# Patient Record
Sex: Male | Born: 1965
Health system: Southern US, Community
[De-identification: ages and names within clinical notes are randomized; demographics above are authoritative.]

## PROBLEM LIST (undated history)

## (undated) DIAGNOSIS — E785 Hyperlipidemia, unspecified: Secondary | ICD-10-CM

## (undated) DIAGNOSIS — G839 Paralytic syndrome, unspecified: Secondary | ICD-10-CM

## (undated) DIAGNOSIS — D496 Neoplasm of unspecified behavior of brain: Secondary | ICD-10-CM

## (undated) DIAGNOSIS — I10 Essential (primary) hypertension: Secondary | ICD-10-CM

## (undated) HISTORY — DX: Hyperlipidemia, unspecified: E78.5

## (undated) HISTORY — PX: OTHER SURGICAL HISTORY: SHX169

## (undated) HISTORY — DX: Essential (primary) hypertension: I10

## (undated) HISTORY — PX: CHOLECYSTECTOMY: SHX55

---

## 2011-12-16 DIAGNOSIS — S83289A Other tear of lateral meniscus, current injury, unspecified knee, initial encounter: Secondary | ICD-10-CM | POA: Insufficient documentation

## 2019-08-23 NOTE — Progress Notes (Deleted)
    Referring-palpitations Reason for referral-self-referral  HPI: 54 year old male for evaluation of palpitations.  Previously lived in [place].  No current outpatient medications on file.   No current facility-administered medications for this visit.    Not on File  No past medical history on file.  *** The histories are not reviewed yet. Please review them in the "History" navigator section and refresh this [place].  Social History   Socioeconomic History  . Marital status: Married    Spouse name: Not on file  . Number of children: Not on file  . Years of education: Not on file  . Highest education level: Not on file  Occupational History  . Not on file  Tobacco Use  . Smoking status: Not on file  Substance and Sexual Activity  . Alcohol use: Not on file  . Drug use: Not on file  . Sexual activity: Not on file  Other Topics Concern  . Not on file  Social History Narrative  . Not on file   Social Determinants of Health   Financial Resource Strain:   . Difficulty of Paying Living Expenses:   Food Insecurity:   . Worried About Charity fundraiser in the Last Year:   . Arboriculturist in the Last Year:   Transportation Needs:   . Film/video editor (Medical):   Marland Kitchen Lack of Transportation (Non-Medical):   Physical Activity:   . Days of Exercise per Week:   . Minutes of Exercise per Session:   Stress:   . Feeling of Stress :   Social Connections:   . Frequency of Communication with Friends and Family:   . Frequency of Social Gatherings with Friends and Family:   . Attends Religious Services:   . Active Member of Clubs or Organizations:   . Attends Archivist Meetings:   Marland Kitchen Marital Status:   Intimate Partner Violence:   . Fear of Current or Ex-Partner:   . Emotionally Abused:   Marland Kitchen Physically Abused:   . Sexually Abused:     No family history on file.  ROS: no fevers or chills, productive cough, hemoptysis, dysphasia, odynophagia, melena,  hematochezia, dysuria, hematuria, rash, seizure activity, orthopnea, PND, pedal edema, claudication. Remaining systems are negative.  Physical Exam:   There were no vitals taken for this visit.  General:  Well developed/well nourished in NAD Skin warm/dry Patient not depressed No peripheral clubbing Back-normal HEENT-normal/normal eyelids Neck supple/normal carotid upstroke bilaterally; no bruits; no JVD; no thyromegaly chest - CTA/ normal expansion CV - RRR/normal S1 and S2; no murmurs, rubs or gallops;  PMI nondisplaced Abdomen -NT/ND, no HSM, no mass, + bowel sounds, no bruit 2+ femoral pulses, no bruits Ext-no edema, chords, 2+ DP Neuro-grossly nonfocal  ECG - personally reviewed  A/P  1 palpitations-  Kirk Ruths, MD

## 2019-08-28 DIAGNOSIS — Z0000 Encounter for general adult medical examination without abnormal findings: Secondary | ICD-10-CM | POA: Diagnosis not present

## 2019-08-28 DIAGNOSIS — I10 Essential (primary) hypertension: Secondary | ICD-10-CM | POA: Diagnosis not present

## 2019-08-28 DIAGNOSIS — Z136 Encounter for screening for cardiovascular disorders: Secondary | ICD-10-CM | POA: Diagnosis not present

## 2019-08-28 DIAGNOSIS — E669 Obesity, unspecified: Secondary | ICD-10-CM | POA: Diagnosis not present

## 2019-08-28 DIAGNOSIS — R002 Palpitations: Secondary | ICD-10-CM | POA: Diagnosis not present

## 2019-08-28 DIAGNOSIS — Z125 Encounter for screening for malignant neoplasm of prostate: Secondary | ICD-10-CM | POA: Diagnosis not present

## 2019-08-28 DIAGNOSIS — Z131 Encounter for screening for diabetes mellitus: Secondary | ICD-10-CM | POA: Diagnosis not present

## 2019-08-29 ENCOUNTER — Ambulatory Visit: Payer: Self-pay | Admitting: Cardiology

## 2019-08-31 DIAGNOSIS — I10 Essential (primary) hypertension: Secondary | ICD-10-CM | POA: Diagnosis not present

## 2019-09-13 DIAGNOSIS — R002 Palpitations: Secondary | ICD-10-CM | POA: Diagnosis not present

## 2019-09-13 DIAGNOSIS — E782 Mixed hyperlipidemia: Secondary | ICD-10-CM | POA: Diagnosis not present

## 2019-09-13 DIAGNOSIS — E669 Obesity, unspecified: Secondary | ICD-10-CM | POA: Diagnosis not present

## 2019-09-13 DIAGNOSIS — I10 Essential (primary) hypertension: Secondary | ICD-10-CM | POA: Diagnosis not present

## 2019-09-13 NOTE — Progress Notes (Deleted)
    Referring-self-referral Reason for referral-palpitations  HPI: 54 year old male for evaluation of palpitations.  No current outpatient medications on file.   No current facility-administered medications for this visit.    Not on File  No past medical history on file.  *** The histories are not reviewed yet. Please review them in the "History" navigator section and refresh this [place].  Social History   Socioeconomic History  . Marital status: Married    Spouse name: Not on file  . Number of children: Not on file  . Years of education: Not on file  . Highest education level: Not on file  Occupational History  . Not on file  Tobacco Use  . Smoking status: Not on file  Substance and Sexual Activity  . Alcohol use: Not on file  . Drug use: Not on file  . Sexual activity: Not on file  Other Topics Concern  . Not on file  Social History Narrative  . Not on file   Social Determinants of Health   Financial Resource Strain:   . Difficulty of Paying Living Expenses:   Food Insecurity:   . Worried About Charity fundraiser in the Last Year:   . Arboriculturist in the Last Year:   Transportation Needs:   . Film/video editor (Medical):   Marland Kitchen Lack of Transportation (Non-Medical):   Physical Activity:   . Days of Exercise per Week:   . Minutes of Exercise per Session:   Stress:   . Feeling of Stress :   Social Connections:   . Frequency of Communication with Friends and Family:   . Frequency of Social Gatherings with Friends and Family:   . Attends Religious Services:   . Active Member of Clubs or Organizations:   . Attends Archivist Meetings:   Marland Kitchen Marital Status:   Intimate Partner Violence:   . Fear of Current or Ex-Partner:   . Emotionally Abused:   Marland Kitchen Physically Abused:   . Sexually Abused:     No family history on file.  ROS: no fevers or chills, productive cough, hemoptysis, dysphasia, odynophagia, melena, hematochezia, dysuria,  hematuria, rash, seizure activity, orthopnea, PND, pedal edema, claudication. Remaining systems are negative.  Physical Exam:   There were no vitals taken for this visit.  General:  Well developed/well nourished in NAD Skin warm/dry Patient not depressed No peripheral clubbing Back-normal HEENT-normal/normal eyelids Neck supple/normal carotid upstroke bilaterally; no bruits; no JVD; no thyromegaly chest - CTA/ normal expansion CV - RRR/normal S1 and S2; no murmurs, rubs or gallops;  PMI nondisplaced Abdomen -NT/ND, no HSM, no mass, + bowel sounds, no bruit 2+ femoral pulses, no bruits Ext-no edema, chords, 2+ DP Neuro-grossly nonfocal  ECG - personally reviewed  A/P  1 palpitations-  Kirk Ruths, MD

## 2019-09-19 ENCOUNTER — Ambulatory Visit: Payer: Self-pay | Admitting: Cardiology

## 2019-09-19 ENCOUNTER — Encounter: Payer: Self-pay | Admitting: Cardiology

## 2019-09-19 ENCOUNTER — Ambulatory Visit (INDEPENDENT_AMBULATORY_CARE_PROVIDER_SITE_OTHER): Payer: BC Managed Care – PPO | Admitting: Cardiology

## 2019-09-19 ENCOUNTER — Other Ambulatory Visit: Payer: Self-pay

## 2019-09-19 ENCOUNTER — Telehealth: Payer: Self-pay | Admitting: Radiology

## 2019-09-19 VITALS — BP 151/109 | HR 73 | Ht 69.0 in | Wt 219.0 lb

## 2019-09-19 DIAGNOSIS — R002 Palpitations: Secondary | ICD-10-CM | POA: Diagnosis not present

## 2019-09-19 DIAGNOSIS — I10 Essential (primary) hypertension: Secondary | ICD-10-CM

## 2019-09-19 MED ORDER — AMLODIPINE BESYLATE 5 MG PO TABS: 5.0000 mg | ORAL_TABLET | Freq: Every day | ORAL | 3 refills | Status: DC

## 2019-09-19 NOTE — Progress Notes (Signed)
Referring-Sean Vanstory PA Reason for referral-palpitations  HPI: 54 year old male for evaluation of palpitations at request of Raelyn Number PA.  Patient has had occasional palpitations for approximately 7 years.  However they have worsened recently.  One type is described as a strong beat.  He also has times when his heart races and is associated with dizziness.  He has not had syncope.  He otherwise denies dyspnea on exertion, orthopnea, PND, pedal edema or exertional chest pain.  Cardiology now asked to evaluate.  Current Outpatient Medications  Medication Sig Dispense Refill  . meloxicam (MOBIC) 7.5 MG tablet Take 7.5 mg by mouth daily.    . rosuvastatin (CRESTOR) 10 MG tablet Take 10 mg by mouth daily.     No current facility-administered medications for this visit.    No Known Allergies   Past Medical History:  Diagnosis Date  . Hyperlipidemia   . Hypertension     Past Surgical History:  Procedure Laterality Date  . Arthroscopic knee surgery    . CHOLECYSTECTOMY      Social History   Socioeconomic History  . Marital status: Married    Spouse name: Not on file  . Number of children: 2  . Years of education: Not on file  . Highest education level: Not on file  Occupational History  . Not on file  Tobacco Use  . Smoking status: Never Smoker  . Smokeless tobacco: Never Used  Substance and Sexual Activity  . Alcohol use: Never  . Drug use: Never  . Sexual activity: Not on file  Other Topics Concern  . Not on file  Social History Narrative  . Not on file   Social Determinants of Health   Financial Resource Strain:   . Difficulty of Paying Living Expenses:   Food Insecurity:   . Worried About Charity fundraiser in the Last Year:   . Arboriculturist in the Last Year:   Transportation Needs:   . Film/video editor (Medical):   Marland Kitchen Lack of Transportation (Non-Medical):   Physical Activity:   . Days of Exercise per Week:   . Minutes of Exercise  per Session:   Stress:   . Feeling of Stress :   Social Connections:   . Frequency of Communication with Friends and Family:   . Frequency of Social Gatherings with Friends and Family:   . Attends Religious Services:   . Active Member of Clubs or Organizations:   . Attends Archivist Meetings:   Marland Kitchen Marital Status:   Intimate Partner Violence:   . Fear of Current or Ex-Partner:   . Emotionally Abused:   Marland Kitchen Physically Abused:   . Sexually Abused:     Family History  Problem Relation Age of Onset  . Hypertension Mother   . Heart attack Father        Age 31    ROS: no fevers or chills, productive cough, hemoptysis, dysphasia, odynophagia, melena, hematochezia, dysuria, hematuria, rash, seizure activity, orthopnea, PND, pedal edema, claudication. Remaining systems are negative.  Physical Exam:   Blood pressure (!) 151/109, pulse 73, height 5\' 9"  (1.753 m), weight 219 lb (99.3 kg).  General:  Well developed/well nourished in NAD Skin warm/dry Patient not depressed No peripheral clubbing Back-normal HEENT-normal/normal eyelids Neck supple/normal carotid upstroke bilaterally; no bruits; no JVD; no thyromegaly chest - CTA/ normal expansion CV - RRR/normal S1 and S2; no murmurs, rubs or gallops;  PMI nondisplaced Abdomen -NT/ND, no HSM, no mass, +  bowel sounds, no bruit 2+ femoral pulses, no bruits Ext-no edema, chords, 2+ DP Neuro-grossly nonfocal  ECG -sinus rhythm at a rate of 73, no ST changes.  Personally reviewed  A/P  1 palpitations-some of his symptoms sound to be PVCs.  However he also feels his heart race at times.  We will continue Toprol 100 mg daily.  Schedule echocardiogram to assess LV function.  We will arrange a 30-day monitor to further assess.  We also will obtain laboratories including TSH from his primary care physician.  2 hypertension-patient's blood pressure is elevated despite Toprol.  Add amlodipine 5 mg daily.  Follow blood pressure and  adjust regimen as needed.  3 hyperlipidemia-continue statin.  Kirk Ruths, MD

## 2019-09-19 NOTE — Telephone Encounter (Signed)
Enrolled patient for a 30 day [employer] Event monitor to be mailed to patients home.  

## 2019-09-19 NOTE — Patient Instructions (Signed)
Medication Instructions:   START AMLODIPINE 5 MG ONCE DAILY  *If you need a refill on your cardiac medications before your next appointment, please call your pharmacy*   Lab Work: If you have labs (blood work) drawn today and your tests are completely normal, you will receive your results only by: Marland Kitchen MyChart Message (if you have MyChart) OR . A paper copy in the mail If you have any lab test that is abnormal or we need to change your treatment, we will call you to review the results.   Testing/Procedures:  Your physician has requested that you have an echocardiogram. Echocardiography is a painless test that uses sound waves to create images of your heart. It provides your doctor with information about the size and shape of your heart and how well your heart's chambers and valves are working. This procedure takes approximately one hour. There are no restrictions for this procedure.[place]  Your physician has recommended that you wear an event monitor. Event monitors are medical devices that record the heart's electrical activity. Doctors most often [place] these monitors to diagnose arrhythmias. Arrhythmias are problems with the speed or rhythm of the heartbeat. The monitor is a small, portable device. You can wear one while you do your normal daily activities. This is usually used to diagnose what is causing palpitations/syncope (passing out).WILL BE MAILED    Follow-Up: At [facility], you and your health needs are our priority.  As part of our continuing mission to provide you with exceptional heart care, we have created designated Provider Care Teams.  These Care Teams include your primary Cardiologist (physician) and Advanced Practice Providers (APPs -  Physician Assistants and Nurse Practitioners) who all work together to provide you with the care you need, when you need it.  We recommend signing up for the patient portal called  "MyChart".  Sign up information is provided on this After Visit Summary.  MyChart is used to connect with patients for Virtual Visits (Telemedicine).  Patients are able to view lab/test results, encounter notes, upcoming appointments, etc.  Non-urgent messages can be sent to your provider as well.   To learn more about what you can do with MyChart, go to [email or web address].    Your next appointment:   6-8 week(s)  The format for your next appointment:   In Person  Provider:   Kirk Ruths, MD

## 2019-09-24 ENCOUNTER — Encounter (INDEPENDENT_AMBULATORY_CARE_PROVIDER_SITE_OTHER): Payer: BC Managed Care – PPO

## 2019-09-24 DIAGNOSIS — R002 Palpitations: Secondary | ICD-10-CM

## 2019-10-05 ENCOUNTER — Ambulatory Visit (HOSPITAL_BASED_OUTPATIENT_CLINIC_OR_DEPARTMENT_OTHER)
Admission: RE | Admit: 2019-10-05 | Discharge: 2019-10-05 | Disposition: A | Discharge status: Home / Self Care | Payer: BC Managed Care – PPO | Source: Ambulatory Visit | Attending: Cardiology | Admitting: Cardiology

## 2019-10-05 ENCOUNTER — Other Ambulatory Visit: Payer: Self-pay

## 2019-10-05 DIAGNOSIS — R002 Palpitations: Secondary | ICD-10-CM

## 2019-11-09 NOTE — Progress Notes (Deleted)
HPI: FU palpitations.  Patient has had occasional palpitations for approximately 7 years.    Echocardiogram June 2021 showed normal LV function.  Monitor June 2021 showed sinus rhythm with 3 and 4 beat runs of nonsustained ventricular tachycardia.  Since last seen  Current Outpatient Medications  Medication Sig Dispense Refill  . amLODipine (NORVASC) 5 MG tablet Take 1 tablet (5 mg total) by mouth daily. 90 tablet 3  . meloxicam (MOBIC) 7.5 MG tablet Take 7.5 mg by mouth daily.    . metoprolol succinate (TOPROL-XL) 100 MG 24 hr tablet Take 100 mg by mouth daily. Take with or immediately following a meal.    . rosuvastatin (CRESTOR) 10 MG tablet Take 10 mg by mouth daily.     No current facility-administered medications for this visit.     Past Medical History:  Diagnosis Date  . Hyperlipidemia   . Hypertension     Past Surgical History:  Procedure Laterality Date  . Arthroscopic knee surgery    . CHOLECYSTECTOMY      Social History   Socioeconomic History  . Marital status: Married    Spouse name: Not on file  . Number of children: 2  . Years of education: Not on file  . Highest education level: Not on file  Occupational History  . Not on file  Tobacco Use  . Smoking status: Never Smoker  . Smokeless tobacco: Never Used  Substance and Sexual Activity  . Alcohol use: Never  . Drug use: Never  . Sexual activity: Not on file  Other Topics Concern  . Not on file  Social History Narrative  . Not on file   Social Determinants of Health   Financial Resource Strain:   . Difficulty of Paying Living Expenses:   Food Insecurity:   . Worried About Charity fundraiser in the Last Year:   . Arboriculturist in the Last Year:   Transportation Needs:   . Film/video editor (Medical):   Marland Kitchen Lack of Transportation (Non-Medical):   Physical Activity:   . Days of Exercise per Week:   . Minutes of Exercise per Session:   Stress:   . Feeling of Stress :   Social  Connections:   . Frequency of Communication with Friends and Family:   . Frequency of Social Gatherings with Friends and Family:   . Attends Religious Services:   . Active Member of Clubs or Organizations:   . Attends Archivist Meetings:   Marland Kitchen Marital Status:   Intimate Partner Violence:   . Fear of Current or Ex-Partner:   . Emotionally Abused:   Marland Kitchen Physically Abused:   . Sexually Abused:     Family History  Problem Relation Age of Onset  . Hypertension Mother   . Heart attack Father        Age 23    ROS: no fevers or chills, productive cough, hemoptysis, dysphasia, odynophagia, melena, hematochezia, dysuria, hematuria, rash, seizure activity, orthopnea, PND, pedal edema, claudication. Remaining systems are negative.  Physical Exam: Well-developed well-nourished in no acute distress.  Skin is warm and dry.  HEENT is normal.  Neck is supple.  Chest is clear to auscultation with normal expansion.  Cardiovascular exam is regular rate and rhythm.  Abdominal exam nontender or distended. No masses palpated. Extremities show no edema. neuro grossly intact  ECG- personally reviewed  A/P  1 palpitations-monitor showed sinus with short runs of nonsustained ventricular tachycardia.  LV  function is normal.  Continue Toprol.  Symptoms have improved.  2 hypertension-patient's blood pressure is controlled.  Continue present medications.  3 hyperlipidemia-continue statin.  Kirk Ruths, MD

## 2019-11-14 ENCOUNTER — Ambulatory Visit: Payer: BC Managed Care – PPO | Admitting: Cardiology

## 2020-04-10 DIAGNOSIS — I10 Essential (primary) hypertension: Secondary | ICD-10-CM | POA: Insufficient documentation

## 2020-04-10 DIAGNOSIS — R002 Palpitations: Secondary | ICD-10-CM | POA: Insufficient documentation

## 2020-04-11 DIAGNOSIS — I10 Essential (primary) hypertension: Secondary | ICD-10-CM | POA: Diagnosis not present

## 2020-04-11 DIAGNOSIS — R000 Tachycardia, unspecified: Secondary | ICD-10-CM | POA: Diagnosis not present

## 2020-04-11 DIAGNOSIS — R002 Palpitations: Secondary | ICD-10-CM | POA: Diagnosis not present

## 2020-05-12 DIAGNOSIS — R059 Cough, unspecified: Secondary | ICD-10-CM | POA: Diagnosis not present

## 2020-05-12 DIAGNOSIS — K591 Functional diarrhea: Secondary | ICD-10-CM | POA: Diagnosis not present

## 2020-05-12 DIAGNOSIS — U071 COVID-19: Secondary | ICD-10-CM | POA: Diagnosis not present

## 2020-05-27 DIAGNOSIS — Z03818 Encounter for observation for suspected exposure to other biological agents ruled out: Secondary | ICD-10-CM | POA: Diagnosis not present

## 2020-10-24 ENCOUNTER — Emergency Department (HOSPITAL_BASED_OUTPATIENT_CLINIC_OR_DEPARTMENT_OTHER): Payer: Managed Care, Other (non HMO)

## 2020-10-24 ENCOUNTER — Emergency Department (HOSPITAL_BASED_OUTPATIENT_CLINIC_OR_DEPARTMENT_OTHER)
Admission: EM | Admit: 2020-10-24 | Discharge: 2020-10-25 | Disposition: A | Discharge status: Home / Self Care | Payer: Managed Care, Other (non HMO) | Attending: Emergency Medicine | Admitting: Emergency Medicine

## 2020-10-24 ENCOUNTER — Other Ambulatory Visit: Payer: Self-pay

## 2020-10-24 ENCOUNTER — Encounter (HOSPITAL_BASED_OUTPATIENT_CLINIC_OR_DEPARTMENT_OTHER): Payer: Self-pay | Admitting: *Deleted

## 2020-10-24 DIAGNOSIS — R930 Abnormal findings on diagnostic imaging of skull and head, not elsewhere classified: Secondary | ICD-10-CM

## 2020-10-24 DIAGNOSIS — R519 Headache, unspecified: Secondary | ICD-10-CM | POA: Insufficient documentation

## 2020-10-24 DIAGNOSIS — I6381 Other cerebral infarction due to occlusion or stenosis of small artery: Secondary | ICD-10-CM | POA: Diagnosis not present

## 2020-10-24 DIAGNOSIS — I10 Essential (primary) hypertension: Secondary | ICD-10-CM | POA: Diagnosis not present

## 2020-10-24 DIAGNOSIS — Z79899 Other long term (current) drug therapy: Secondary | ICD-10-CM | POA: Insufficient documentation

## 2020-10-24 DIAGNOSIS — R001 Bradycardia, unspecified: Secondary | ICD-10-CM | POA: Diagnosis not present

## 2020-10-24 DIAGNOSIS — R55 Syncope and collapse: Secondary | ICD-10-CM | POA: Insufficient documentation

## 2020-10-24 LAB — CBC WITH DIFFERENTIAL/PLATELET
Abs Immature Granulocytes: 0.01 10*3/uL (ref 0.00–0.07)
Basophils Absolute: 0.1 10*3/uL (ref 0.0–0.1)
Basophils Relative: 1 %
Eosinophils Absolute: 0.2 10*3/uL (ref 0.0–0.5)
Eosinophils Relative: 3 %
HCT: 41.7 % (ref 39.0–52.0)
Hemoglobin: 13.7 g/dL (ref 13.0–17.0)
Immature Granulocytes: 0 %
Lymphocytes Relative: 22 %
Lymphs Abs: 1.1 10*3/uL (ref 0.7–4.0)
MCH: 29.6 pg (ref 26.0–34.0)
MCHC: 32.9 g/dL (ref 30.0–36.0)
MCV: 90.1 fL (ref 80.0–100.0)
Monocytes Absolute: 0.6 10*3/uL (ref 0.1–1.0)
Monocytes Relative: 12 %
Neutro Abs: 3.2 10*3/uL (ref 1.7–7.7)
Neutrophils Relative %: 62 %
Platelets: 222 10*3/uL (ref 150–400)
RBC: 4.63 MIL/uL (ref 4.22–5.81)
RDW: 12.3 % (ref 11.5–15.5)
WBC: 5.1 10*3/uL (ref 4.0–10.5)
nRBC: 0 % (ref 0.0–0.2)

## 2020-10-24 LAB — BASIC METABOLIC PANEL
Anion gap: 7 (ref 5–15)
BUN: 15 mg/dL (ref 6–20)
CO2: 26 mmol/L (ref 22–32)
Calcium: 9.2 mg/dL (ref 8.9–10.3)
Chloride: 105 mmol/L (ref 98–111)
Creatinine, Ser: 1.23 mg/dL (ref 0.61–1.24)
GFR, Estimated: >60 mL/min (ref >60)
Glucose, Bld: 100 mg/dL — ABNORMAL HIGH (ref 70–99)
Potassium: 4.2 mmol/L (ref 3.5–5.1)
Sodium: 138 mmol/L (ref 135–145)

## 2020-10-24 LAB — CBG MONITORING, ED: Glucose-Capillary: 95 mg/dL (ref 70–99)

## 2020-10-24 MED ORDER — IOHEXOL 350 MG/ML SOLN
100.0000 mL | Freq: Once | INTRAVENOUS | Status: AC | PRN
  Administered 2020-10-24: 100 mL via INTRAVENOUS

## 2020-10-24 NOTE — ED Provider Notes (Signed)
[facility] EMERGENCY DEPARTMENT Provider Note   CSN: [id number] Arrival date & time: 10/24/20  2027     History Chief Complaint  Patient presents with   Loss of Consciousness    Sean Jarid Sasso. is a 55 y.o. male.  Patient presents to the emergency department for evaluation of syncope and headaches.  Patient states that he has a history of palpitations, normal echocardiogram about a year ago, is currently on carvedilol.  Today while at work he began to feel lightheaded like he was going to pass out.  He recovered after about 2 minutes.  He was not exerting himself at the time.  About an hour later, while driving home, he had another episode that resolved.  He made it to a family members house and was inside sitting on the bed and had a episode of full syncope.  No seizure-like activity. Patient denies signs of stroke including: facial droop, slurred speech, aphasia, weakness/numbness in extremities, imbalance/trouble walking.  No associated chest pain.  No recent nausea, vomiting, or diarrhea.  Patient drinks a lot of water.  No black stools or bloody stools.  No anticoagulation.  During the day today, including yesterday, he has a headache.  No associated fevers, neck pain or confusion.  Sometimes he will get headaches at the back of the head and posterior neck.  No thunderclap headaches reported.      Past Medical History:  Diagnosis Date   Hyperlipidemia    Hypertension     There are no problems to display for this patient.   Past Surgical History:  Procedure Laterality Date   Arthroscopic knee surgery     CHOLECYSTECTOMY         Family History  Problem Relation Age of Onset   Hypertension Mother    Heart attack Father        Age 38    Social History   Tobacco Use   Smoking status: Never   Smokeless tobacco: Never  Vaping Use   Vaping Use: Never used  Substance Use Topics   Alcohol use: Never   Drug use: Never    Home Medications Prior  to Admission medications   Medication Sig Start Date End Date Taking? Authorizing Provider  meloxicam (MOBIC) 7.5 MG tablet Take 7.5 mg by mouth daily. 09/12/12  Yes [provider]  metoprolol succinate (TOPROL-XL) 100 MG 24 hr tablet Take 100 mg by mouth daily. Take with or immediately following a meal.   Yes [provider]  rosuvastatin (CRESTOR) 10 MG tablet Take 10 mg by mouth daily.   Yes [provider]  amLODipine (NORVASC) 5 MG tablet Take 1 tablet (5 mg total) by mouth daily. 09/19/19 12/18/19  Lelon Perla, MD    Allergies    Patient has no known allergies.  Review of Systems   Review of Systems  Constitutional:  Negative for fever.  HENT:  Negative for congestion, dental problem, rhinorrhea and sinus pressure.   Eyes:  Negative for photophobia, discharge, redness and visual disturbance.  Respiratory:  Negative for shortness of breath.   Cardiovascular:  Negative for chest pain.  Gastrointestinal:  Negative for nausea and vomiting.  Musculoskeletal:  Negative for gait problem, neck pain and neck stiffness.  Skin:  Negative for rash.  Neurological:  Positive for syncope, light-headedness and headaches. Negative for facial asymmetry, speech difficulty, weakness and numbness.  Psychiatric/Behavioral:  Negative for confusion.    Physical Exam Updated Vital Signs BP (!) 149/96  Pulse (!) 47   Temp 98.4 F (36.9 C) (Oral)   Resp 14   Ht 5\' 9"  (1.753 m)   Wt 101.7 kg   SpO2 100%   BMI 33.11 kg/m   Physical Exam Vitals and nursing note reviewed.  Constitutional:      Appearance: He is well-developed.  HENT:     Head: Normocephalic and atraumatic.     Right Ear: Tympanic membrane, ear canal and external ear normal.     Left Ear: Tympanic membrane, ear canal and external ear normal.     Nose: Nose normal.     Mouth/Throat:     Pharynx: Uvula midline.  Eyes:     General: Lids are normal.     Conjunctiva/sclera: Conjunctivae normal.      Pupils: Pupils are equal, round, and reactive to light.  Cardiovascular:     Rate and Rhythm: Normal rate and regular rhythm.  Pulmonary:     Effort: Pulmonary effort is normal.     Breath sounds: Normal breath sounds.  Abdominal:     Palpations: Abdomen is soft.     Tenderness: There is no abdominal tenderness.  Musculoskeletal:        General: Normal range of motion.     Cervical back: Normal range of motion and neck supple. No tenderness or bony tenderness.  Skin:    General: Skin is warm and dry.  Neurological:     Mental Status: He is alert and oriented to person, place, and time.     GCS: GCS eye subscore is 4. GCS verbal subscore is 5. GCS motor subscore is 6.     Cranial Nerves: No cranial nerve deficit.     Sensory: No sensory deficit.     Motor: No abnormal muscle tone.     Coordination: Coordination normal.     Gait: Gait normal.    ED Results / Procedures / Treatments   Labs (all labs ordered are listed, but only abnormal results are displayed) Labs Reviewed  BASIC METABOLIC PANEL - Abnormal; Notable for the following components:      Result Value   Glucose, Bld 100 (*)    All other components within normal limits  CBC WITH DIFFERENTIAL/PLATELET  CBG MONITORING, ED    EKG EKG Interpretation  Date/Time:  Friday October 24 2020 20:38:14 EDT Ventricular Rate:  54 PR Interval:  160 QRS Duration: 72 QT Interval:  418 QTC Calculation: 396 R Axis:   88 Text Interpretation: Sinus bradycardia Otherwise normal ECG No old tracing to compare Confirmed by Malvin Johns [phone]) on 10/24/2020 9:38:33 PM  Radiology CT Angio Head W or Wo Contrast  Result Date: 10/25/2020 CLINICAL DATA:  Initial evaluation for acute dizziness, abnormal head CT. EXAM: CT ANGIOGRAPHY HEAD TECHNIQUE: Multidetector CT imaging of the head was performed using the standard protocol during bolus administration of intravenous contrast. Multiplanar CT image reconstructions and MIPs were obtained to  evaluate the vascular anatomy. CONTRAST:  140mL OMNIPAQUE IOHEXOL 350 MG/ML SOLN COMPARISON:  Prior head CT from earlier the same day. FINDINGS: CTA HEAD Anterior circulation: Visualized distal cervical segments of the internal carotid arteries are somewhat tortuous but widely patent without abnormality. Petrous, cavernous, and supraclinoid segments patent without stenosis or other abnormality. A1 segments patent bilaterally. Normal anterior communicating artery complex. Anterior cerebral arteries patent to their distal aspects without stenosis. No M1 stenosis or occlusion. Normal MCA bifurcations. Distal MCA branches well perfused and symmetric. Posterior circulation: Both vertebral arteries patent to the vertebrobasilar  junction without stenosis. Right PICA origin patent and normal. Left PICA not seen. Basilar patent to its distal aspect without stenosis. Superior cerebral arteries patent bilaterally. Both PCAs primarily supplied via the basilar well perfused to their distal aspects. Venous sinuses: Patent allowing for timing the contrast bolus. Increased lobulated density seen about the parasellar region and prepontine cistern, corresponding with abnormality on prior CT. This does not fill with contrast material, which could be related to timing of the contrast bolus. The adjacent cavernous sinuses normal. Superior orbital veins within normal limits. Anatomic variants: None significant.  No aneurysm. Review of the MIP images confirms the above findings. IMPRESSION: 1. Negative CTA of the head. No large vessel occlusion, hemodynamically significant stenosis. No aneurysm, AVM, or other vascular abnormality. 2. Irregular lobulated density about the parasellar region and prepontine cistern, corresponding with abnormality on prior CT. This does not fill with contrast material on this exam, suggesting that this density is not vascular in nature. Further assessment with dedicated MRI, with and without contrast is  recommended for further characterization. Additionally, correlation with LP and CSF studies may be helpful for further evaluation as well. Electronically Signed   By: Jeannine Boga M.D.   On: 10/25/2020 00:38   CT Head Wo Contrast  Result Date: 10/24/2020 CLINICAL DATA:  Headache, intracranial hemorrhage suspected Dizziness, non-specific syncope, headache EXAM: CT HEAD WITHOUT CONTRAST TECHNIQUE: Contiguous axial images were obtained from the base of the skull through the vertex without intravenous contrast. COMPARISON:  None. FINDINGS: Brain: There are remote lacunar infarcts involving the left caudate head and thalamus. No intracranial hemorrhage, mass effect, or midline shift. No hydrocephalus. The basilar cisterns are patent. No evidence of territorial infarct or acute ischemia. No extra-axial or intracranial fluid collection. Vascular: There is no hyperdense MCA or abnormal calcifications. There is some rounded increased density in the parasellar region bilaterally, series 2, image 11. Skull: Normal. Negative for fracture or focal lesion. Sinuses/Orbits: Paranasal sinuses and mastoid air cells are clear. The visualized orbits are unremarkable. Other: None. IMPRESSION: 1. No acute intracranial abnormality. 2. Remote lacunar infarcts in the left caudate head and thalamus. 3. Rounded increased density in the parasellar region bilaterally is nonspecific. This may represent tortuous skull base vessels, recommend CTA to exclude possibility of aneurysm. Electronically Signed   By: Keith Rake M.D.   On: 10/24/2020 22:14    Procedures Procedures   Medications Ordered in ED Medications  iohexol (OMNIPAQUE) 350 MG/ML injection 100 mL (100 mLs Intravenous Contrast Given 10/24/20 2303)    ED Course  I have reviewed the triage vital signs and the nursing notes.  Pertinent labs & imaging results that were available during my care of the patient were reviewed by me and considered in my medical  decision making (see chart for details).  Patient seen and examined. Work-up initiated.  Will check labs, head CT, orthostatics.  EKG shows sinus bradycardia in the 50s.  Vital signs reviewed and are as follows: BP (!) 149/96   Pulse (!) 47   Temp 98.4 F (36.9 C) (Oral)   Resp 14   Ht 5\' 9"  (1.753 m)   Wt 101.7 kg   SpO2 100%   BMI 33.11 kg/m   CT with abnormalities as noted. Discussed with Dr. Tamera Punt and reviewed with patient and family. Will proceed with CTA to r/o aneurysm.   12:23 AM awaiting results of CTA.  Patient has been up in the hallway and seen ambulating without any difficulties.  Orthostatic VS for  the past 24 hrs:  BP- Lying Pulse- Lying BP- Sitting Pulse- Sitting BP- Standing at 0 minutes Pulse- Standing at 0 minutes  10/24/20 2251 128/85 (!) 48 (!) 145/94 63 148/90 58   If CTA is reassuring, plan to d/c carvedilol, have him rest and hydrate well, follow-up with PCP and cardiologist next week.  Encouraged to call for an appointment on Monday.  CTA with no concerning vascular findings however ill-defined area still noted.  Discussed CT findings with current attending, Dr. Florina Ou.  At this point, I feel the patient can be discharged home.  He will need to follow-up with neurology for further evaluation, likely to include MRI.  Ambulatory referral to neurology made.  Patient and wife updated on results and plan.  We discussed strict return instructions.  Patient counseled to return if they have weakness in their arms or legs, slurred speech, trouble walking or talking, confusion, trouble with their balance, or if they have any other concerns.  Encouraged return with additional episodes of syncope, lightheadedness.  Patient verbalizes understanding and agrees with plan.   Given syncopal episode, also recommended cardiology follow-up.  Patient and wife seem a bit frustrated that we do not have an exact answer for what is going on with him tonight, however they do  understand importance of follow-up.     MDM Rules/Calculators/A&P                          Near syncope spells and one syncopal spell today.  Patient had positive prodrome.  EKG with sinus bradycardia without any blocks, prolonged QTC, or findings of Brugada syndrome, WPW etc.  He is on a beta-blocker.  Labs reassuring.  Head CT with abnormalities, CTA ordered.  CTA without any vascular findings, however there is an ill-defined nonspecific area which will need follow-up with MRI.  At this point patient is back to his neurologic baseline and without other concerning findings, do not feel that he needs to be transferred for emergent MRI tonight.  However neurology referral was made on his behalf for close follow-up.  Strict return instructions discussed as above.  Ideally patient will follow-up with neurology and cardiology, and let his primary care doctor know what is going on.  Patient and his wife seem reliable to return if symptoms do worsen or change.     Final Clinical Impression(s) / ED Diagnoses Final diagnoses:  Syncope, unspecified syncope type  Sinus bradycardia  Lacunar infarction [facility])  Abnormal head CT    Rx / DC Orders ED Discharge Orders     None        Carlisle Cater, PA-C 10/25/20 2025    Malvin Johns, MD 10/25/20 2126

## 2020-10-24 NOTE — ED Triage Notes (Signed)
States he passed out 3 times today. Denies pain or medical problems. EKG and CBG at triage.

## 2020-10-25 NOTE — Discharge Instructions (Addendum)
Please read and follow all provided instructions.  Your diagnoses today include:  1. Syncope, unspecified syncope type   2. Sinus bradycardia     Tests performed today include: An EKG of your heart - shows normal rhythm but slow Blood counts and electrolytes CT of the head - non-contrast CT showed lacunar infarct and possibility of abnormal blood vessels CT angio of the head -shows normal appearing healthy blood vessels but does show an ill-defined area at the base of the brain. It is unclear based on the CT scan what exactly this is. We need you to follow-up with neurology to have this further evaluated. Vital signs. See below for your results today.   Medications prescribed:  None  Take any prescribed medications only as directed.  Follow-up instructions: Please follow-up with your PCP and cardiologist regarding your symptoms. Please discontinue the carvedilol you were prescribed until you follow-up with your doctor. This medication can slow down your heart rate and make you more likely to pass out.   Return instructions:  SEEK IMMEDIATE MEDICAL ATTENTION IF: You have severe chest pain, especially if the pain is crushing or pressure-like and spreads to the arms, back, neck, or jaw, or if you have sweating, nausea (feeling sick to your stomach), or shortness of breath. THIS IS AN EMERGENCY. Don't wait to see if the pain will go away. Get medical help at once. Call 911 or [phone] (operator). DO NOT drive yourself to the hospital.  Your chest pain gets worse and does not go away with rest.  You have an attack of chest pain lasting longer than usual, despite rest and treatment with the medications your caregiver has prescribed.  You wake from sleep with chest pain or shortness of breath. You feel dizzy or faint. You have chest pain not typical of your usual pain for which you originally saw your caregiver.  You have any other emergent concerns regarding your health.  Your vital signs today  were: BP (!) 144/93   Pulse (!) 52   Temp 98.4 F (36.9 C) (Oral)   Resp 12   Ht 5\' 9"  (1.753 m)   Wt 101.7 kg   SpO2 100%   BMI 33.11 kg/m  If your blood pressure (BP) was elevated above 135/85 this visit, please have this repeated by your doctor within one month. --------------

## 2020-10-27 ENCOUNTER — Telehealth: Payer: Self-pay | Admitting: Cardiology

## 2020-10-27 NOTE — Progress Notes (Signed)
HPI: Follow-up palpitations and syncope.  Echocardiogram June 2021 showed normal LV function.  Monitor June 2021 showed sinus rhythm with 3 and 4 beat runs of nonsustained ventricular tachycardia but no other significant arrhythmia.  Seen with syncope in the emergency room recently.  Head CTA showed no significant vascular abnormality.  There is note of an irregular density in the parasellar region and prepontine cistern and MRI recommended.  Hemoglobin 13.7.  Etiology of syncope was felt to be unclear.  He was asked to follow-up with cardiology and neurology.  Since last seen patient states that on the day of his event he had 3 separate episodes.  Each was preceded by diaphoresis and palpitations followed by near syncope on the first 2 events.  On the second he was driving.  They are short-lived lasting less than 1 minute.  When he was at home he was lying on the bed and had a third episode and had frank syncope lasting less than 1 minute.  Note he denies dyspnea on exertion, orthopnea, PND, pedal edema, exertional chest pain.  Current Outpatient Medications  Medication Sig Dispense Refill   carvedilol (COREG) 6.25 MG tablet Take 6.25 mg by mouth 2 (two) times daily.     meloxicam (MOBIC) 7.5 MG tablet Take 7.5 mg by mouth daily.     metoprolol succinate (TOPROL-XL) 100 MG 24 hr tablet Take 100 mg by mouth daily. Take with or immediately following a meal.     rosuvastatin (CRESTOR) 10 MG tablet Take 10 mg by mouth daily.     amLODipine (NORVASC) 5 MG tablet Take 1 tablet (5 mg total) by mouth daily. 90 tablet 3   No current facility-administered medications for this visit.     Past Medical History:  Diagnosis Date   Hyperlipidemia    Hypertension     Past Surgical History:  Procedure Laterality Date   Arthroscopic knee surgery     CHOLECYSTECTOMY      Social History   Socioeconomic History   Marital status: Married    Spouse name: Not on file   Number of children: 2   Years  of education: Not on file   Highest education level: Not on file  Occupational History   Not on file  Tobacco Use   Smoking status: Never   Smokeless tobacco: Never  Vaping Use   Vaping Use: Never used  Substance and Sexual Activity   Alcohol use: Never   Drug use: Never   Sexual activity: Not on file  Other Topics Concern   Not on file  Social History Narrative   Not on file   Social Determinants of Health   Financial Resource Strain: Not on file  Food Insecurity: Not on file  Transportation Needs: Not on file  Physical Activity: Not on file  Stress: Not on file  Social Connections: Not on file  Intimate Partner Violence: Not on file    Family History  Problem Relation Age of Onset   Hypertension Mother    Heart attack Father        Age 75    ROS: no fevers or chills, productive cough, hemoptysis, dysphasia, odynophagia, melena, hematochezia, dysuria, hematuria, rash, seizure activity, orthopnea, PND, pedal edema, claudication. Remaining systems are negative.  Physical Exam: Well-developed well-nourished in no acute distress.  Skin is warm and dry.  HEENT is normal.  Neck is supple.  Chest is clear to auscultation with normal expansion.  Cardiovascular exam is regular rate and  rhythm.  Abdominal exam nontender or distended. No masses palpated. Extremities show no edema. neuro grossly intact  ECG-October 24, 2020 showed sinus bradycardia with no ST changes and normal QT interval.  Personally reviewed  A/P  1 presyncope/syncope-etiology unclear.  Episodes were preceded by palpitations.  Previous monitor was unrevealing but not clear to me that he had the same symptoms while the monitor was in place.  We discussed a watch to record strips and he will purchase.  I will repeat his echocardiogram to reassess LV function.  I have asked him to not drive for 6 months following his recent syncopal episode.  2 palpitations-beta-blocker was discontinued as he was somewhat  bradycardic.  If he develops recurrent palpitations will resume lower dose.  3 hypertension-blood pressure borderline.  We will follow and adjust regimen as needed.  4 hyperlipidemia-Per primary care.  Kirk Ruths, MD

## 2020-10-27 NOTE — Telephone Encounter (Signed)
     Pt c/o medication issue:  1. Name of Medication: beta blockers  2. How are you currently taking this medication (dosage and times per day)?   3. Are you having a reaction (difficulty breathing--STAT)?   4. What is your medication issue? Pt's wife said, pt was in the ED and was told to stopped taking beta blockers and to f/u with Dr. Stanford Breed, she said pt will leave this wednesday and will be gone for a month and hoping to get in today to see Dr. Stanford Breed or APP.

## 2020-10-27 NOTE — Telephone Encounter (Signed)
Returned call to wife-appt scheduled tomorrow at 8:20 AM with Dr. Stanford Breed.

## 2020-10-28 ENCOUNTER — Ambulatory Visit (INDEPENDENT_AMBULATORY_CARE_PROVIDER_SITE_OTHER): Payer: Managed Care, Other (non HMO) | Admitting: Diagnostic Neuroimaging

## 2020-10-28 ENCOUNTER — Encounter: Payer: Self-pay | Admitting: *Deleted

## 2020-10-28 ENCOUNTER — Encounter: Payer: Self-pay | Admitting: Diagnostic Neuroimaging

## 2020-10-28 ENCOUNTER — Ambulatory Visit (INDEPENDENT_AMBULATORY_CARE_PROVIDER_SITE_OTHER): Payer: Managed Care, Other (non HMO) | Admitting: Cardiology

## 2020-10-28 ENCOUNTER — Other Ambulatory Visit: Payer: Self-pay

## 2020-10-28 ENCOUNTER — Encounter: Payer: Self-pay | Admitting: Cardiology

## 2020-10-28 VITALS — BP 134/88 | HR 71 | Ht 69.0 in | Wt 223.0 lb

## 2020-10-28 VITALS — BP 140/90 | HR 73 | Ht 69.0 in | Wt 225.4 lb

## 2020-10-28 DIAGNOSIS — R55 Syncope and collapse: Secondary | ICD-10-CM

## 2020-10-28 DIAGNOSIS — R002 Palpitations: Secondary | ICD-10-CM | POA: Diagnosis not present

## 2020-10-28 DIAGNOSIS — I10 Essential (primary) hypertension: Secondary | ICD-10-CM | POA: Diagnosis not present

## 2020-10-28 DIAGNOSIS — G4700 Insomnia, unspecified: Secondary | ICD-10-CM | POA: Diagnosis not present

## 2020-10-28 NOTE — Patient Instructions (Signed)
LIGHTHEADEDNESS / PRE-SYNCOPE / SYNCOPE / PALPITATIONS / SLEEP DEPRIVATION  - check MRI brain w/wo  - check sleep study  - According to [place] law, you can not drive unless you are syncope free for at least 6 months and under physician's care.   - Please maintain precautions. Do not participate in activities where a loss of awareness could harm you or someone else. No swimming alone, no tub bathing, no hot tubs, no driving, no operating motorized vehicles (cars, ATVs, motocycles, etc), lawnmowers, power tools or firearms. No standing at heights, such as rooftops, ladders or stairs. Avoid hot objects such as stoves, heaters, open fires. Wear a helmet when riding a bicycle, scooter, skateboard, etc. and avoid areas of traffic. Set your water heater to 120 degrees or less.

## 2020-10-28 NOTE — Progress Notes (Signed)
[facility]  PATIENT: Sean Carrillo. DOB: 19-Jun-1965  REFERRING CLINICIAN: Carlisle Cater, PA-C HISTORY FROM: patient  REASON FOR VISIT: new consult    HISTORICAL  CHIEF COMPLAINT:  Chief Complaint  Patient presents with   Abnormal CT scan    Rm 7 New Pt, ED referral, wife- [name] "passed out on plane 2 years ago; recently passed out at home, per wife has had other episodes of passing out; abnormal CT scan in ED"    Headache    HISTORY OF PRESENT ILLNESS:   55 year old male here for evaluation of loss of consciousness.  Last Friday patient was driving when he developed tunnel vision, sweating, decreased hearing.  He was able to continue driving to his family's home.  Once he arrived there he came inside and passed out.  He was unresponsive for about 1 minute.  He woke up feeling very tired and drenched with sweat.  No definite convulsions or tongue biting.  2015 and 2020 patient had other episodes of passing out.  Patient has decreased sleep averaging 4 to 6 hours at night.  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  Sometimes his work schedule interferes with sleep.   REVIEW OF SYSTEMS: Full 14 system review of systems performed and negative with exception of: as per HPI.  ALLERGIES: No Known Allergies  HOME MEDICATIONS: Outpatient Medications Prior to Visit  Medication Sig Dispense Refill   amLODipine (NORVASC) 5 MG tablet Take 1 tablet (5 mg total) by mouth daily. 90 tablet 3   azithromycin (ZITHROMAX) 250 MG tablet Take 250 mg by mouth as directed.     benzonatate (TESSALON) 200 MG capsule Take 200 mg by mouth 3 (three) times daily as needed.     carvedilol (COREG) 6.25 MG tablet Take 6.25 mg by mouth 2 (two) times daily.     meloxicam (MOBIC) 7.5 MG tablet Take 7.5 mg by mouth daily.     predniSONE (DELTASONE) 20 MG tablet Take 20 mg by mouth 2 (two) times daily.     rosuvastatin (CRESTOR) 10 MG tablet Take 10 mg by  mouth daily.     No facility-administered medications prior to visit.    PAST MEDICAL HISTORY: Past Medical History:  Diagnosis Date   Hyperlipidemia    Hypertension     PAST SURGICAL HISTORY: Past Surgical History:  Procedure Laterality Date   Arthroscopic knee surgery     CHOLECYSTECTOMY      FAMILY HISTORY: Family History  Problem Relation Age of Onset   Hypertension Mother    Heart attack Father        Age 34    SOCIAL HISTORY: Social History   Socioeconomic History   Marital status: Married    Spouse name: [name]   Number of children: 2   Years of education: Not on file   Highest education level: Some college, no degree  Occupational History   Not on file  Tobacco Use   Smoking status: Never   Smokeless tobacco: Never  Vaping Use   Vaping Use: Never used  Substance and Sexual Activity   Alcohol use: Never   Drug use: Never   Sexual activity: Not on file  Other Topics Concern   Not on file  Social History Narrative   Lives with wife   Caffeine- rare   Social Determinants of Health   Financial Resource Strain: Not on file  Food Insecurity: Not on file  Transportation Needs: Not on file  Physical Activity: Not on file  Stress: Not on file  Social Connections: Not on file  Intimate Partner Violence: Not on file     PHYSICAL EXAM  GENERAL EXAM/CONSTITUTIONAL: Vitals:  Vitals:   10/28/20 1017  BP: 140/90  Pulse: 73  Weight: 225 lb 6.4 oz (102.2 kg)  Height: 5\' 9"  (1.753 m)   Body mass index is 33.29 kg/m. Wt Readings from Last 3 Encounters:  10/28/20 225 lb 6.4 oz (102.2 kg)  10/28/20 223 lb (101.2 kg)  10/24/20 224 lb 3.2 oz (101.7 kg)   Patient is in no distress; well developed, nourished and groomed; neck is supple  CARDIOVASCULAR: Examination of carotid arteries is normal; no carotid bruits Regular rate and rhythm, no murmurs Examination of peripheral vascular system by observation and palpation is  normal  EYES: Ophthalmoscopic exam of optic discs and posterior segments is normal; no papilledema or hemorrhages No results found.  MUSCULOSKELETAL: Gait, strength, tone, movements noted in Neurologic exam below  NEUROLOGIC: MENTAL STATUS:  No flowsheet data found. awake, alert, oriented to person, place and time recent and remote memory intact normal attention and concentration language fluent, comprehension intact, naming intact fund of knowledge appropriate  CRANIAL NERVE:  2nd - no papilledema on fundoscopic exam 2nd, 3rd, 4th, 6th - pupils equal and reactive to light, visual fields full to confrontation, extraocular muscles intact, no nystagmus 5th - facial sensation symmetric 7th - facial strength symmetric 8th - hearing intact 9th - palate elevates symmetrically, uvula midline 11th - shoulder shrug symmetric 12th - tongue protrusion midline  MOTOR:  normal bulk and tone, full strength in the BUE, BLE  SENSORY:  normal and symmetric to light touch, temperature, vibration  COORDINATION:  finger-nose-finger, fine finger movements normal  REFLEXES:  deep tendon reflexes present and symmetric  GAIT/STATION:  narrow based gait     DIAGNOSTIC DATA (LABS, IMAGING, TESTING) - I reviewed patient records, labs, notes, testing and imaging myself where available.  Lab Results  Component Value Date   WBC 5.1 10/24/2020   HGB 13.7 10/24/2020   HCT 41.7 10/24/2020   MCV 90.1 10/24/2020   PLT 222 10/24/2020      Component Value Date/Time   NA 138 10/24/2020 2217   K 4.2 10/24/2020 2217   CL 105 10/24/2020 2217   CO2 26 10/24/2020 2217   GLUCOSE 100 (H) 10/24/2020 2217   BUN 15 10/24/2020 2217   CREATININE 1.23 10/24/2020 2217   CALCIUM 9.2 10/24/2020 2217   GFRNONAA >60 10/24/2020 2217   No results found for: CHOL, HDL, LDLCALC, LDLDIRECT, TRIG, CHOLHDL No results found for: HGBA1C No results found for: VITAMINB12 No results found for:  TSH      ASSESSMENT AND PLAN  55 y.o. year old male here with:  Dx:  1. Syncope, unspecified syncope type   2. Insomnia, unspecified type     PLAN:  LIGHTHEADEDNESS / PRE-SYNCOPE / SYNCOPE / PALPITATIONS / SLEEP DEPRIVATION (10/24/20)  - check MRI brain w/wo  - check sleep study  - According to [place] law, you can not drive unless you are syncope free for at least 6 months and under physician's care.   - Please maintain precautions. Do not participate in activities where a loss of awareness could harm you or someone else. No swimming alone, no tub bathing, no hot tubs, no driving, no operating motorized vehicles (cars, ATVs, motocycles, etc), lawnmowers, power tools or firearms. No standing at heights, such as rooftops, ladders or stairs. Avoid hot  objects such as stoves, heaters, open fires. Wear a helmet when riding a bicycle, scooter, skateboard, etc. and avoid areas of traffic. Set your water heater to 120 degrees or less.   Orders Placed This Encounter  Procedures   MR BRAIN W WO CONTRAST   Vitamin B12   Hemoglobin A1c   TSH   Ambulatory referral to Sleep Studies   Return for pending if symptoms worsen or fail to improve, pending test results.    Penni Bombard, MD 6/78/9381, 01:75 AM Certified in Neurology, Neurophysiology and Neuroimaging  [facility] [address], [place], [place] [address] [phone]

## 2020-10-28 NOTE — Patient Instructions (Signed)
  Testing/Procedures:  Your physician has requested that you have an echocardiogram. Echocardiography is a painless test that uses sound waves to create images of your heart. It provides your doctor with information about the size and shape of your heart and how well your heart's chambers and valves are working. This procedure takes approximately one hour. There are no restrictions for this procedure. [place]   Follow-Up: At [facility], you and your health needs are our priority.  As part of our continuing mission to provide you with exceptional heart care, we have created designated Provider Care Teams.  These Care Teams include your primary Cardiologist (physician) and Advanced Practice Providers (APPs -  Physician Assistants and Nurse Practitioners) who all work together to provide you with the care you need, when you need it.  We recommend signing up for the patient portal called "MyChart".  Sign up information is provided on this After Visit Summary.  MyChart is used to connect with patients for Virtual Visits (Telemedicine).  Patients are able to view lab/test results, encounter notes, upcoming appointments, etc.  Non-urgent messages can be sent to your provider as well.   To learn more about what you can do with MyChart, go to [email or web address].    Your next appointment:   3-4 month(s)  The format for your next appointment:   In Person  Provider:   Kirk Ruths, MD

## 2020-10-29 ENCOUNTER — Encounter: Payer: Self-pay | Admitting: *Deleted

## 2020-10-29 ENCOUNTER — Encounter: Payer: Self-pay | Admitting: Diagnostic Neuroimaging

## 2020-10-29 LAB — TSH: TSH: 2.28 u[IU]/mL (ref 0.450–4.500)

## 2020-10-29 LAB — HEMOGLOBIN A1C
Est. average glucose Bld gHb Est-mCnc: 94 mg/dL
Hgb A1c MFr Bld: 4.9 % (ref 4.8–5.6)

## 2020-10-29 LAB — VITAMIN B12: Vitamin B-12: 742 pg/mL (ref 232–1245)

## 2020-11-18 ENCOUNTER — Other Ambulatory Visit: Payer: Self-pay

## 2020-11-18 ENCOUNTER — Encounter: Payer: Self-pay | Admitting: Neurology

## 2020-11-18 ENCOUNTER — Ambulatory Visit: Payer: Managed Care, Other (non HMO) | Admitting: Neurology

## 2020-11-18 VITALS — BP 140/93 | HR 55 | Ht 69.0 in | Wt 223.5 lb

## 2020-11-18 DIAGNOSIS — F5104 Psychophysiologic insomnia: Secondary | ICD-10-CM | POA: Diagnosis not present

## 2020-11-18 DIAGNOSIS — G933 Postviral fatigue syndrome: Secondary | ICD-10-CM | POA: Diagnosis not present

## 2020-11-18 DIAGNOSIS — R55 Syncope and collapse: Secondary | ICD-10-CM

## 2020-11-18 DIAGNOSIS — G9331 Postviral fatigue syndrome: Secondary | ICD-10-CM

## 2020-11-18 MED ORDER — TRAZODONE HCL 50 MG PO TABS: 50.0000 mg | ORAL_TABLET | Freq: Every evening | ORAL | 5 refills | Status: DC | PRN

## 2020-11-18 NOTE — Addendum Note (Signed)
Addended by: Larey Seat on: 11/18/2020 12:17 PM   Modules accepted: Orders

## 2020-11-18 NOTE — Progress Notes (Signed)
SLEEP MEDICINE CLINIC    Provider:  Larey Seat, MD  Primary Care Physician:  Trey Sailors, [place] [address] [place] [address]     Referring Provider: Penni Bombard, [address] [place],  [place] [address]          Chief Complaint according to patient   Patient presents with:     New Patient (Initial Visit)     Insomnia ,       HISTORY OF PRESENT ILLNESS:  Haytham Maher. is a 55 y.o. year old 50 or [place] American male patient seen here as a referral on 11/18/2020 from Dr Leta Baptist, for  INSOMNIA unspecified.    Chief concern according to patient :  problems to fall asleep and to stay asleep, present for years. 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     I have the pleasure of seeing Michaelanthony Kempton. today, a right -handed Black or African American male with a possible sleep disorder.    has a past medical history of COVID 25- twice - June 2022 and  January 2022. SYNCOPE,  sinusitis, Hyperlipidemia and Hypertension. Headaches.  Had electrocution 20 years ago. He reports palpitations, feels easily startled, hypervigilant. Heart monitor was normal, PVCs.  He just drove back from [place], experienced dizziness and tunnel vision. He felt he had to keep himself awake while driving, was ready to fall asleep. palpitations and syncope.  Echocardiogram June 2021 showed normal LV function.  Monitor June 2021 showed sinus rhythm with 3 and 4 beat runs of nonsustained ventricular tachycardia but no other significant arrhythmia.  Seen with syncope in the emergency room recently.  Head CTA showed no significant vascular abnormality.  There is note of an irregular density in the parasellar region and prepontine cistern and MRI recommended.  Hemoglobin 13.7.  Etiology of syncope was felt to be unclear.  He was asked to follow-up with cardiology and neurology.  Since last seen patient states that on the  day of his event he had 3 separate episodes.  Each was preceded by diaphoresis and palpitations followed by near syncope on the first 2 events.  On the second he was driving.  They are short-lived lasting less than 1 minute.  When he was at home he was lying on the bed and had a third episode and had frank syncope lasting less than 1 minute.  Note he denies dyspnea on exertion, orthopnea, PND, pedal edema, exertional chest pain.   Family medical /sleep history: father passed away with heart disease, CAD- not on CPAP with OSA.   Social history:  Patient is working an Engineer, civil (consulting)-  and lives in a household with spouse-  no children in the home- but a dog is present.   Tobacco use; never .  ETOH use ; none , Caffeine intake in form of Coffee( when driving ) and takes "Vanquish "a pain reliever with caffeine.      Sleep habits are as follows: The patient's dinner time and bed time are highly variable- 3-4 AM.  The preferred sleep position is non supine , non snorer- sleeping with the support of 2 pillows. Dreams are reportedly frequent/vivid.  Woken up by dreams.  Variable rise time. The patient wakes up spontaneously at 5.30-6 AM with an alarm.  Average hours of sleep less than 5 .  He reports not feeling refreshed or restored in AM, with symptoms  such as dry mouth, morning headaches, and residual fatigue.  Naps are taken infrequently.    Review of Systems: Out of a complete 14 system review, the patient complains of only the following symptoms, and all other reviewed systems are negative.:  Fatigue, sleepiness  nocturia - fragmented sleep, Insomnia , for initiation and maintenance of sleep.    How likely are you to doze in the following situations: 0 = not likely, 1 = slight chance, 2 = moderate chance, 3 = high chance   Sitting and Reading? Watching Television? Sitting inactive in a public place (theater or meeting)? As a passenger in a car for an hour without a break? Lying down in the  afternoon when circumstances permit? Sitting and talking to someone? Sitting quietly after lunch without alcohol? In a car, while stopped for a few minutes in traffic?   Total = 15/ 24 points   FSS endorsed at 38/ 63 points.   Social History   Socioeconomic History   Marital status: Married    Spouse name: [name]   Number of children: 2   Years of education: Not on file   Highest education level: Some college, no degree  Occupational History   Not on file  Tobacco Use   Smoking status: Never   Smokeless tobacco: Never  Vaping Use   Vaping Use: Never used  Substance and Sexual Activity   Alcohol use: Never   Drug use: Never   Sexual activity: Not on file  Other Topics Concern   Not on file  Social History Narrative   Lives with wife   Caffeine- rare   Social Determinants of Health   Financial Resource Strain: Not on file  Food Insecurity: Not on file  Transportation Needs: Not on file  Physical Activity: Not on file  Stress: Not on file  Social Connections: Not on file    Family History  Problem Relation Age of Onset   Hypertension Mother    Heart attack Father        Age 23    Past Medical History:  Diagnosis Date   Hyperlipidemia    Hypertension     Past Surgical History:  Procedure Laterality Date   Arthroscopic knee surgery     CHOLECYSTECTOMY       Current Outpatient Medications on File Prior to Visit  Medication Sig Dispense Refill   carvedilol (COREG) 6.25 MG tablet Take 6.25 mg by mouth 2 (two) times daily.     No current facility-administered medications on file prior to visit.    No Known Allergies  Physical exam:  Today's Vitals   11/18/20 1116  BP: (!) 140/93  Pulse: (!) 55  Weight: 223 lb 8 oz (101.4 kg)  Height: 5\' 9"  (1.753 m)   Body mass index is 33.01 kg/m.   Wt Readings from Last 3 Encounters:  11/18/20 223 lb 8 oz (101.4 kg)  10/28/20 225 lb 6.4 oz (102.2 kg)  10/28/20 223 lb (101.2 kg)     Ht Readings from  Last 3 Encounters:  11/18/20 5\' 9"  (1.753 m)  10/28/20 5\' 9"  (1.753 m)  10/28/20 5\' 9"  (1.753 m)      General: The patient is awake, alert and appears not in acute distress. The patient is well groomed.  Head: Normocephalic, atraumatic. Neck is supple. Mallampati  3 ,  neck circumference:17.5  inches . Nasal airflow  patent.  Retrognathia is not seen.  Dental status: biological  Cardiovascular:  Regular rate and cardiac rhythm  by pulse,  without distended neck veins. Respiratory: Lungs are clear to auscultation.  Skin:  Without evidence of ankle edema, or rash. Trunk: The patient's posture is erect.   Neurologic exam : The patient is awake and alert, oriented to place and time.   Memory subjective described as intact.  Attention span & concentration ability appears normal.  Speech is fluent,  without  dysarthria, dysphonia or aphasia.  Mood and affect are appropriate.   Cranial nerves: no loss of smell or taste reported  Pupils are equal and briskly reactive to light. Funduscopic exam deferred. .  Extraocular movements in vertical and horizontal planes were intact and without nystagmus. No Diplopia. Visual fields by finger perimetry are intact. Hearing was intact to soft voice and finger rubbing.    Facial sensation intact to fine touch.  Facial motor strength is symmetric and tongue and uvula move midline.  Neck ROM : rotation, tilt and flexion extension were normal for age and shoulder shrug was symmetrical.    Motor exam:  Symmetric bulk, tone and ROM.   Normal tone without cog wheeling, symmetric grip strength .   Sensory:  Fine touch, pinprick and vibration were tested  and  normal.  Proprioception tested in the upper extremities was normal.   Coordination: Rapid alternating movements in the fingers/hands were of normal speed.  The Finger-to-nose maneuver was intact without evidence of ataxia, dysmetria or tremor.   Gait and station: Patient could rise unassisted from a  seated position, walked without assistive device.  Stance is of normal width/ base and the patient turned with 3 steps.  Toe and heel walk were deferred.  Deep tendon reflexes: in the  upper and lower extremities are symmetric and intact.  Babinski response was deferred .       After spending a total time of  35  minutes face to face and additional time for physical and neurologic examination, review of laboratory studies,  personal review of imaging studies, reports and results of other testing and review of referral information / records as far as provided in visit, I have established the following assessments:  Mr. Muchow saw Dr. Leta Baptist on October 28, 2020 after suffering a syncopal spell which has not been his first.  But he also experienced a significant increase in fatigue, and onset of very vivid dreams with a short latency to dream stage.  He has a history of insomnia for sleep initiation and problems to stay asleep as well partially due to nocturia and increased fluid intake may be also here.  He is not a snorer according to his spouse.  Also I cannot rule out that this patient may have sleep apnea it seems not to be the observation of spouse or family members.  I think he has a post viral fatigue syndrome decreased with his second bout of COVID he became myalgic, tired and in addition he has difficulties in his employment situation to have a routine sleep pattern.  The first line treatment of insomnia is to establish at bedtime a rise time and to avoid naps in daytime.  And this will be very difficult for someone who has intermittent late night gigs.    My Plan is to proceed with:  1) HST  2)postviral fatigue - there is no cure, just supportive care. Palpitation is also  a symtom he has experienced.   3) anxiety- insomnia.  sleep hygiene discussed. No TV, no blue screen light, and cool bedroom. Melatonin.   I  would like to thank  Penni Bombard, [place] [address],  [place] [id number] for allowing me to meet with and to take care of this pleasant patient.   In short, Nora Rooke. is presenting with chronic insomnia, a psychological problem, and new onset postviral fatigue.    I plan to follow up through our NP within 2-4 month only if HST is positive.    CC: I will share my notes with Andrey Spearman, MD .  Electronically signed by: Larey Seat, MD 11/18/2020 11:48 AM  [facility] and [facility] Sleep Board certified by The [employer] of Sleep Medicine and Diplomate of the [employer] of Sleep Medicine. Board certified In Neurology through the [place], Fellow of the [employer] of Neurology. Medical Director of [employer].

## 2020-12-17 ENCOUNTER — Encounter (HOSPITAL_COMMUNITY): Payer: Self-pay | Admitting: Cardiology

## 2020-12-17 ENCOUNTER — Ambulatory Visit (HOSPITAL_COMMUNITY): Payer: Managed Care, Other (non HMO) | Attending: Cardiology

## 2020-12-23 ENCOUNTER — Other Ambulatory Visit: Payer: Self-pay

## 2020-12-23 ENCOUNTER — Ambulatory Visit
Admission: RE | Admit: 2020-12-23 | Discharge: 2020-12-23 | Disposition: A | Discharge status: Home / Self Care | Payer: Managed Care, Other (non HMO) | Source: Ambulatory Visit | Attending: Diagnostic Neuroimaging | Admitting: Diagnostic Neuroimaging

## 2020-12-23 DIAGNOSIS — G4700 Insomnia, unspecified: Secondary | ICD-10-CM

## 2020-12-23 DIAGNOSIS — R55 Syncope and collapse: Secondary | ICD-10-CM

## 2020-12-23 MED ORDER — GADOBENATE DIMEGLUMINE 529 MG/ML IV SOLN
20.0000 mL | Freq: Once | INTRAVENOUS | Status: AC | PRN
  Administered 2020-12-23: 20 mL via INTRAVENOUS

## 2020-12-25 ENCOUNTER — Telehealth: Payer: Self-pay | Admitting: Diagnostic Neuroimaging

## 2020-12-25 ENCOUNTER — Other Ambulatory Visit: Payer: Self-pay | Admitting: Diagnostic Neuroimaging

## 2020-12-25 DIAGNOSIS — R9089 Other abnormal findings on diagnostic imaging of central nervous system: Secondary | ICD-10-CM

## 2020-12-25 DIAGNOSIS — G9389 Other specified disorders of brain: Secondary | ICD-10-CM

## 2020-12-25 NOTE — Progress Notes (Signed)
Orders Placed This Encounter  Procedures   CT CHEST W CONTRAST   CT ABDOMEN W CONTRAST   CT PELVIS W CONTRAST   DG FL GUIDED LUMBAR PUNCTURE    Penni Bombard, MD [id number], [id number] PM Certified in Neurology, Neurophysiology and Neuroimaging  [facility] [address], [place],  [address] [phone]

## 2020-12-25 NOTE — Telephone Encounter (Signed)
I called patient and reviewed MRI results. Possible mass vs inflamm lesion. Will consider CSF testing, CT chest / abd / pelvis and neuro-oncology / neurosurgery consults.   Patient otherwise feeling ok. No headaches, dizziness. No more syncope events.    Penni Bombard, MD [id number], [id number] PM Certified in Neurology, Neurophysiology and Neuroimaging  [facility] [address] [place], [place] [address] [phone]

## 2020-12-26 ENCOUNTER — Other Ambulatory Visit: Payer: Self-pay | Admitting: Radiation Therapy

## 2020-12-29 ENCOUNTER — Telehealth: Payer: Self-pay | Admitting: Diagnostic Neuroimaging

## 2020-12-29 ENCOUNTER — Inpatient Hospital Stay: Payer: Managed Care, Other (non HMO)

## 2020-12-29 NOTE — Telephone Encounter (Signed)
cigna order sent to [facility]. They will obtain the auth and reach out to the patient tos chedule.

## 2021-01-01 ENCOUNTER — Other Ambulatory Visit: Payer: Self-pay

## 2021-01-01 ENCOUNTER — Ambulatory Visit (HOSPITAL_COMMUNITY): Payer: Managed Care, Other (non HMO) | Attending: Cardiology

## 2021-01-01 DIAGNOSIS — R55 Syncope and collapse: Secondary | ICD-10-CM

## 2021-01-02 LAB — ECHOCARDIOGRAM COMPLETE
Area-P 1/2: 4.49 cm2
S' Lateral: 2.8 cm

## 2021-01-06 ENCOUNTER — Other Ambulatory Visit: Payer: Self-pay

## 2021-01-12 ENCOUNTER — Inpatient Hospital Stay: Payer: Managed Care, Other (non HMO)

## 2021-01-13 MED ORDER — MIDAZOLAM HCL 2 MG/2ML IJ SOLN
INTRAMUSCULAR | Status: AC
  Filled 2021-01-13: qty 2

## 2021-01-13 MED ORDER — FENTANYL CITRATE (PF) 250 MCG/5ML IJ SOLN
INTRAMUSCULAR | Status: AC
  Filled 2021-01-13: qty 5

## 2021-01-13 MED ORDER — PROPOFOL 10 MG/ML IV BOLUS
INTRAVENOUS | Status: AC
  Filled 2021-01-13: qty 20

## 2021-01-20 ENCOUNTER — Telehealth: Payer: Self-pay | Admitting: Diagnostic Neuroimaging

## 2021-01-20 ENCOUNTER — Other Ambulatory Visit: Payer: Managed Care, Other (non HMO)

## 2021-01-20 NOTE — Telephone Encounter (Signed)
Dr. Felecia Shelling, would you be willing to do a peer to peer for Dr. Chase Caller or wait for him to return? Thanks!

## 2021-01-20 NOTE — Telephone Encounter (Signed)
I called pt's wife and spoke with the pt. Advised we are still working on this and I would update once I know more.

## 2021-01-20 NOTE — Telephone Encounter (Addendum)
Pt's wife called referrals and I took the call. I listened to her questions/complaints and offered apologies on the circumstances. Advised we will try and get the peer to peer information and see if one of the work in docs will complete this case while Dr. Leta Baptist is out of the office.   I called [facility] I spoke with Laytanya, I asked when the authorization was submitted since the pt was called last night and told the peer to peer was needed. She sts she did not know when the authorization was submitted and would have to get back with me. I also asked what the number would be for the peer to peer and she sts she did not know and she would have to get back with me. Teena Dunk sts she will forward message to Astrid Divine  I discussed verbally with Raquel Sarna and she will keep me up todate on this.

## 2021-01-20 NOTE — Telephone Encounter (Signed)
[facility] Albin Felling) called, need a peer to peer done by the physician to get authorization. Want to see if physician will do a peer to peer. Wife calling us very upset how this is being handled. Would like a call back.  Contact info: [phone], ext: [phone]

## 2021-01-20 NOTE — Telephone Encounter (Signed)
Based on [employer] Pelvis Imaging Guidelines Section(s): PV 1.0 General Guidelines, we cannot approve this request. Your healthcare provider told us that you may have symptoms related to your pelvis. The request cannot be approved because:  You must have results of a recent clinical evaluation (such as physical exam and history) related to your chief complaint or current condition that support the requested study. We need to see results of relevant laboratory studies such as blood, urine, and/or stool that show this study is needed. Your records do not show results of an ultrasound (a picture study using sound waves) that support further imaging. [address] 1 CT CHEST; with contrast Denied Based on [employer] Chest Imaging Guidelines Section(s): CH 1.0 General Guidelines, we cannot approve this request. Your healthcare provider told us that you may have a problem with your lungs. The request cannot be approved because:  We did not receive a detailed history that shows this study is needed. Your records do not show results of a prior picture study such as x-ray or ultrasound (a picture study using sound waves) that support further imaging. We did not receive physical exam results that show a reason this study is needed.  The case number is [id number]. There is no deadline for a peer to peer. It just needs to be scheduled. The phone number is [phone] option 4.

## 2021-01-20 NOTE — Telephone Encounter (Signed)
Did we receive any feed back on this?

## 2021-01-20 NOTE — Telephone Encounter (Signed)
Pt's wife called stating that she was informed that the pts CT was denied and that a Peer to Peer will be the fasted way to get the information that they are needing. Peer to Peer number is [phone] Option 4 They are trying to get the pt back on the schedule for noon and he is needing to take the medication starting at 10am. Please advise.

## 2021-01-21 ENCOUNTER — Ambulatory Visit (INDEPENDENT_AMBULATORY_CARE_PROVIDER_SITE_OTHER): Payer: Managed Care, Other (non HMO) | Admitting: Neurology

## 2021-01-21 ENCOUNTER — Ambulatory Visit
Admission: RE | Admit: 2021-01-21 | Discharge: 2021-01-21 | Disposition: A | Discharge status: Home / Self Care | Payer: Managed Care, Other (non HMO) | Source: Ambulatory Visit | Attending: Diagnostic Neuroimaging | Admitting: Diagnostic Neuroimaging

## 2021-01-21 ENCOUNTER — Other Ambulatory Visit: Payer: Self-pay

## 2021-01-21 ENCOUNTER — Other Ambulatory Visit (HOSPITAL_COMMUNITY)
Admission: RE | Admit: 2021-01-21 | Discharge: 2021-01-21 | Disposition: A | Discharge status: Home / Self Care | Payer: Managed Care, Other (non HMO) | Source: Ambulatory Visit | Attending: Diagnostic Neuroimaging | Admitting: Diagnostic Neuroimaging

## 2021-01-21 VITALS — BP 141/80 | HR 58

## 2021-01-21 DIAGNOSIS — G9331 Postviral fatigue syndrome: Secondary | ICD-10-CM

## 2021-01-21 DIAGNOSIS — R9089 Other abnormal findings on diagnostic imaging of central nervous system: Secondary | ICD-10-CM

## 2021-01-21 DIAGNOSIS — G9389 Other specified disorders of brain: Secondary | ICD-10-CM

## 2021-01-21 DIAGNOSIS — G4710 Hypersomnia, unspecified: Secondary | ICD-10-CM

## 2021-01-21 DIAGNOSIS — F5104 Psychophysiologic insomnia: Secondary | ICD-10-CM

## 2021-01-21 DIAGNOSIS — G933 Postviral fatigue syndrome: Secondary | ICD-10-CM

## 2021-01-21 DIAGNOSIS — R55 Syncope and collapse: Secondary | ICD-10-CM

## 2021-01-21 MED ORDER — DIAZEPAM 5 MG PO TABS
10.0000 mg | ORAL_TABLET | Freq: Once | ORAL | Status: AC
  Administered 2021-01-21: 10 mg via ORAL

## 2021-01-21 NOTE — Discharge Instructions (Signed)

## 2021-01-21 NOTE — Telephone Encounter (Signed)
I called the pt back and left vm ( ok per dpr) advising we have obtained the peer to peer authorization.   Pt was advised [facility] will be reaching out about this and to call us back if any other issues come up.

## 2021-01-22 ENCOUNTER — Inpatient Hospital Stay: Admission: RE | Admit: 2021-01-22 | Payer: Managed Care, Other (non HMO) | Source: Ambulatory Visit

## 2021-01-22 ENCOUNTER — Other Ambulatory Visit: Payer: Self-pay | Admitting: Diagnostic Neuroimaging

## 2021-01-22 ENCOUNTER — Ambulatory Visit
Admission: RE | Admit: 2021-01-22 | Discharge: 2021-01-22 | Disposition: A | Discharge status: Home / Self Care | Payer: Managed Care, Other (non HMO) | Source: Ambulatory Visit | Attending: Diagnostic Neuroimaging | Admitting: Diagnostic Neuroimaging

## 2021-01-22 DIAGNOSIS — G9389 Other specified disorders of brain: Secondary | ICD-10-CM

## 2021-01-22 DIAGNOSIS — R9089 Other abnormal findings on diagnostic imaging of central nervous system: Secondary | ICD-10-CM

## 2021-01-22 LAB — CYTOLOGY - NON PAP

## 2021-01-22 MED ORDER — IOPAMIDOL (ISOVUE-300) INJECTION 61%
100.0000 mL | Freq: Once | INTRAVENOUS | Status: AC | PRN
  Administered 2021-01-22: 100 mL via INTRAVENOUS

## 2021-01-26 ENCOUNTER — Inpatient Hospital Stay: Payer: Managed Care, Other (non HMO) | Attending: Internal Medicine

## 2021-01-26 ENCOUNTER — Telehealth: Payer: Self-pay

## 2021-01-26 ENCOUNTER — Telehealth: Payer: Self-pay | Admitting: Neurology

## 2021-01-26 NOTE — Telephone Encounter (Signed)
I called pt and advised of results he verbalized understanding and is agreeable to repeat of MRI.   Pt sts he has been doing well. Some h/a have been noted. Denies any new syncopal events.

## 2021-01-26 NOTE — Telephone Encounter (Signed)
-----   Message from Penni Bombard, MD sent at 01/26/2021 12:47 PM EDT ----- Unremarkable CT chest, abd, pelvis results overall. Has a thyroid nodule that can be followed up with PCP. Otherwise if patient stable, then I will repeat MRI brain -VRP

## 2021-01-26 NOTE — Progress Notes (Signed)
[facility] TEST REPORT ( by Watch PAT)   STUDY DATE:  02-11-1968 DOB: March 14, 1966     ORDERING CLINICIAN: Larey Seat, MD  REFERRING CLINICIAN: Andrey Spearman, MD    CLINICAL INFORMATION/HISTORY:  Chief complaint: Reports problems to fall asleep and to stay asleep, present for years. Chronic INSOMNIA. 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  Sean Carrillo. had COVID 64- twice - June 2022 and  January 2022.  SYNCOPE, sinusitis, Hyperlipidemia and Hypertension, Headaches.   Had electrocution 20 years ago. He reports palpitations, feels easily startled, hypervigilant. Heart monitor was normal, only PVCs were seen.  He just drove back from [place], and on the trip he experienced dizziness and tunnel vision. He felt he had to keep himself awake while driving, was ready to fall asleep.    Epworth sleepiness score: 15/24.   BMI: 33 kg/m   Neck Circumference: 18"   FINDINGS:   Sleep Summary:   Total Recording Time (hours, min): Total recording time amounted to 9 hours and 5 minutes of which 7 hours and 39 minutes were considered sleep time,   REM proportion of total sleep time was 20.1%                                      Respiratory Indices:   Calculated pAHI (per hour): Overall apnea-hypopnea index was 3.4/h during REM sleep elevated to 11.1/h and in non-REM sleep 1.5/h.  Positional component of apnea was also noted in supine position the AHI was 7.7/h and in nonsupine 2.1/h.  Snoring level was average with a mean volume of 40 dB, only 6% of the total sleep time were associated with snoring.                                                                   Oxygen Saturation Statistics: O2 Saturation Range (%): Oxygen saturation varied between a nadir at 88% and a maximum of 97% with a mean oxygen saturation of 92%.  There was no significant time in hypoxia noted.   Pulse Rate Statistics: Pulse  rate varied between 50 bpm and 88 bpm at maximum the mean heart rate was 65 bpm.  Please note that a home sleep test cannot address questions of cardiac arrhythmia only the cardiac rate is reflected.                                            IMPRESSION:  This HST does not indicate the presence of of sleep apnea to a clinically significant degree.  Sleep study could not answer the question by the patient may be excessively daytime sleepy either.  There was neither hypoxia, nor severe snoring nor apnea present.  A chronic insomnia component may be related to anxiety and the recent COVID infection may also have added a postviral fatigue component.  RECOMMENDATION: The patient will follow up with his primary neurologist, Dr. Andrey Spearman.   INTERPRETING PHYSICIAN:   Sean Seat, MD  Medical Director of [facility] [employer].

## 2021-01-26 NOTE — Telephone Encounter (Signed)
I called pt and discussed his questions. Pt verbalized understanding of why CT's were recommended along with results of CT  Penumalli, Vikram R, MD  P Gna-Pod 3 Results Unremarkable CT chest, abd, pelvis results overall. Has a thyroid nodule that can be followed up with PCP. Otherwise if patient stable, then I will repeat MRI brain -VRP   Pt will proceed with MRI repeat as recommended.

## 2021-01-26 NOTE — Telephone Encounter (Signed)
Pt called wanting to know what exactly he is being treated for, feels like he is not getting specific answers. Pt requesting a call back.

## 2021-01-29 ENCOUNTER — Other Ambulatory Visit: Payer: Self-pay | Admitting: *Deleted

## 2021-01-29 MED ORDER — TRAZODONE HCL 50 MG PO TABS: 50.0000 mg | ORAL_TABLET | Freq: Every evening | ORAL | 1 refills | Status: DC | PRN

## 2021-02-02 NOTE — Telephone Encounter (Signed)
I called patient. Reviewed information and plan of repeating MRI and then consider biopsy.    Penni Bombard, MD 32/08/4980, 6:41 PM Certified in Neurology, Neurophysiology and Neuroimaging  [facility] [address] [place], [place] [address] [phone]

## 2021-02-02 NOTE — Telephone Encounter (Addendum)
Called patient who stated his insurance isn't approving repeat MRI. He stated the denial letter stated he has cancer, and the doctor is trying to find out where it started. He said he was never told that, is concerned that is in the insurnace letter and would like to talk to Dr Leta Baptist. I advised we have not received the denial letter. He will send a copy via my chart. I advised will let Dr Leta Baptist know of denial and his concerns and his request for a call. Patient verbalized understanding, appreciation.

## 2021-02-02 NOTE — Telephone Encounter (Signed)
Pt called states he received a letter from his insurance company regarding his MRI. Has some questions requesting a call back.

## 2021-02-04 ENCOUNTER — Encounter: Payer: Self-pay | Admitting: *Deleted

## 2021-02-04 ENCOUNTER — Telehealth: Payer: Self-pay | Admitting: Cardiology

## 2021-02-04 MED ORDER — CARVEDILOL 6.25 MG PO TABS
6.2500 mg | ORAL_TABLET | Freq: Two times a day (BID) | ORAL | 1 refills | Status: DC
Start: 2021-02-04 — End: 2022-04-23

## 2021-02-04 NOTE — Telephone Encounter (Signed)
*  STAT* If patient is at the pharmacy, call can be transferred to refill team.   1. Which medications need to be refilled? (please list name of each medication and dose if known)  carvedilol (COREG) 6.25 MG tablet  2. Which pharmacy/location (including street and city if local pharmacy) is medication to be sent to? [facility], [place] [address]  3. Do they need a 30 day or 90 day supply?  30 day supply

## 2021-02-06 ENCOUNTER — Other Ambulatory Visit: Payer: Self-pay | Admitting: Radiation Therapy

## 2021-02-10 DIAGNOSIS — F5104 Psychophysiologic insomnia: Secondary | ICD-10-CM | POA: Insufficient documentation

## 2021-02-10 DIAGNOSIS — R55 Syncope and collapse: Secondary | ICD-10-CM | POA: Insufficient documentation

## 2021-02-10 DIAGNOSIS — G9331 Postviral fatigue syndrome: Secondary | ICD-10-CM | POA: Insufficient documentation

## 2021-02-10 NOTE — Progress Notes (Signed)
IMPRESSION:  This HST does not indicate the presence of of sleep apnea to a clinically significant degree.  Sleep study could not answer the question by the patient may be excessively daytime sleepy either.  There was neither hypoxia, nor severe snoring nor apnea present.  A chronic insomnia component may be related to anxiety and the recent COVID infection may also have added a postviral fatigue component. The patient did not endorse narcolepsy like symptoms.   RECOMMENDATION: The patient will follow up with his primary neurologist, Dr. Andrey Spearman.

## 2021-02-10 NOTE — Procedures (Signed)
[facility] TEST REPORT ( by Watch PAT)   STUDY DATE:  02-11-1968 DOB: [birth year]     ORDERING CLINICIAN: Larey Seat, MD  REFERRING CLINICIAN: Andrey Spearman, MD    CLINICAL INFORMATION/HISTORY:  Chief complaint: Reports problems to fall asleep and to stay asleep, present for years. Chronic INSOMNIA. 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  Sean Carrillo. had COVID 37- twice - June 2022 and  January 2022.  SYNCOPE, sinusitis, Hyperlipidemia and Hypertension, Headaches.   Had electrocution 20 years ago. He reports palpitations, feels easily startled, hypervigilant. Heart monitor was normal, only PVCs were seen.  He just drove back from [place], and on the trip he experienced dizziness and tunnel vision. He felt he had to keep himself awake while driving, was ready to fall asleep.    Epworth sleepiness score: 15/24.   BMI: 33 kg/m   Neck Circumference: 18"   FINDINGS:   Sleep Summary:   Total Recording Time (hours, min): Total recording time amounted to 9 hours and 5 minutes of which 7 hours and 39 minutes were considered sleep time,   REM proportion of total sleep time was 20.1%                                      Respiratory Indices:   Calculated pAHI (per hour): Overall apnea-hypopnea index was 3.4/h during REM sleep elevated to 11.1/h and in non-REM sleep 1.5/h.  Positional component of apnea was also noted in supine position the AHI was 7.7/h and in nonsupine 2.1/h.  Snoring level was average with a mean volume of 40 dB, only 6% of the total sleep time were associated with snoring.                                                                   Oxygen Saturation Statistics: O2 Saturation Range (%): Oxygen saturation varied between a nadir at 88% and a maximum of 97% with a mean oxygen saturation of 92%.  There was no significant time in hypoxia noted.   Pulse Rate Statistics: Pulse rate  varied between 50 bpm and 88 bpm at maximum the mean heart rate was 65 bpm.  Please note that a home sleep test cannot address questions of cardiac arrhythmia only the cardiac rate is reflected.                                            IMPRESSION:  This HST does not indicate the presence of of sleep apnea to a clinically significant degree.  Sleep study could not answer the question by the patient may be excessively daytime sleepy either.  There was neither hypoxia, nor severe snoring nor apnea present.  A chronic insomnia component may be related to anxiety and the recent COVID infection may also have added a postviral fatigue component.  RECOMMENDATION: The patient will follow up with his primary neurologist, Dr. Andrey Spearman.   INTERPRETING PHYSICIAN:   Larey Seat, MD   Medical Director  of [facility].

## 2021-02-12 ENCOUNTER — Telehealth: Payer: Self-pay | Admitting: Neurology

## 2021-02-12 NOTE — Telephone Encounter (Signed)
Called the patient and was able to review the information from the HST with him. Reviewed in detail with the patient. Pt verbalized understanding. Pt had no questions at this time but was encouraged to call back if questions arise.

## 2021-02-12 NOTE — Telephone Encounter (Signed)
-----   Message from Larey Seat, MD sent at 02/10/2021  5:36 PM EDT ----- IMPRESSION:  This HST does not indicate the presence of of sleep apnea to a clinically significant degree.  Sleep study could not answer the question by the patient may be excessively daytime sleepy either.  There was neither hypoxia, nor severe snoring nor apnea present.  A chronic insomnia component may be related to anxiety and the recent COVID infection may also have added a postviral fatigue component. The patient did not endorse narcolepsy like symptoms.   RECOMMENDATION: The patient will follow up with his primary neurologist, Dr. Andrey Spearman.

## 2021-02-16 ENCOUNTER — Ambulatory Visit
Admission: RE | Admit: 2021-02-16 | Discharge: 2021-02-16 | Disposition: A | Discharge status: Home / Self Care | Payer: Managed Care, Other (non HMO) | Source: Ambulatory Visit | Attending: Diagnostic Neuroimaging | Admitting: Diagnostic Neuroimaging

## 2021-02-16 ENCOUNTER — Other Ambulatory Visit: Payer: Self-pay

## 2021-02-16 DIAGNOSIS — G9389 Other specified disorders of brain: Secondary | ICD-10-CM

## 2021-02-16 DIAGNOSIS — R9089 Other abnormal findings on diagnostic imaging of central nervous system: Secondary | ICD-10-CM

## 2021-02-16 MED ORDER — GADOBENATE DIMEGLUMINE 529 MG/ML IV SOLN
20.0000 mL | Freq: Once | INTRAVENOUS | Status: AC | PRN
  Administered 2021-02-16: 20 mL via INTRAVENOUS

## 2021-02-17 NOTE — Progress Notes (Signed)
HPI: Follow-up palpitations and syncope. Monitor June 2021 showed sinus rhythm with 3 and 4 beat runs of nonsustained ventricular tachycardia but no other significant arrhythmia.  Seen with syncope in the emergency room 6/22.  Head CTA showed no significant vascular abnormality.  There is note of an irregular density in the parasellar region and prepontine cistern and MRI recommended.  Hemoglobin 13.7.  Etiology of syncope was felt to be unclear.  He was asked to follow-up with cardiology and neurology.  Echocardiogram September 2022 showed normal LV function, mild left ventricular hypertrophy.  Since last seen patient patient denies dyspnea, chest pain or recurrent syncope.  He has occasional palpitations that have some improvement with Coreg.  Current Outpatient Medications  Medication Sig Dispense Refill   carvedilol (COREG) 6.25 MG tablet Take 1 tablet (6.25 mg total) by mouth 2 (two) times daily. 30 tablet 1   rosuvastatin (CRESTOR) 40 MG tablet Take 40 mg by mouth daily.     traZODone (DESYREL) 50 MG tablet Take 1 tablet (50 mg total) by mouth at bedtime as needed for sleep. 90 tablet 1   No current facility-administered medications for this visit.     Past Medical History:  Diagnosis Date   Hyperlipidemia    Hypertension     Past Surgical History:  Procedure Laterality Date   Arthroscopic knee surgery     CHOLECYSTECTOMY      Social History   Socioeconomic History   Marital status: Married    Spouse name: [name]   Number of children: 2   Years of education: Not on file   Highest education level: Some college, no degree  Occupational History   Not on file  Tobacco Use   Smoking status: Never   Smokeless tobacco: Never  Vaping Use   Vaping Use: Never used  Substance and Sexual Activity   Alcohol use: Never   Drug use: Never   Sexual activity: Not on file  Other Topics Concern   Not on file  Social History Narrative   Lives with wife   Caffeine- rare    Social Determinants of Health   Financial Resource Strain: Not on file  Food Insecurity: Not on file  Transportation Needs: Not on file  Physical Activity: Not on file  Stress: Not on file  Social Connections: Not on file  Intimate Partner Violence: Not on file    Family History  Problem Relation Age of Onset   Hypertension Mother    Heart attack Father        Age 25    ROS: no fevers or chills, productive cough, hemoptysis, dysphasia, odynophagia, melena, hematochezia, dysuria, hematuria, rash, seizure activity, orthopnea, PND, pedal edema, claudication. Remaining systems are negative.  Physical Exam: Well-developed well-nourished in no acute distress.  Skin is warm and dry.  HEENT is normal.  Neck is supple.  Chest is clear to auscultation with normal expansion.  Cardiovascular exam is regular rate and rhythm.  Abdominal exam nontender or distended. No masses palpated. Extremities show no edema. neuro grossly intact  ECG-sinus bradycardia at a rate of 57, no ST changes.  Personally reviewed  A/P  1 presyncope/syncope-previous echocardiogram showed normal LV function.  He has not had recurrences.  Can consider implantable loop in the future if episodes become more frequent.  2 hypertension-patient's blood pressure is controlled.  Continue present medications.  3 hyperlipidemia-managed by primary care.  4 palpitations-continue beta-blocker.  Note he now has purchased a smart watch that will  record rhythm strips and he will forward those to [place] for review if he records.  Kirk Ruths, MD

## 2021-02-19 LAB — CSF CELL COUNT WITH DIFFERENTIAL
Basophils, %: 0 %
Lymphs, CSF: 87 % — ABNORMAL HIGH (ref 40–80)
Monocyte/Macrophage: 13 % — ABNORMAL LOW (ref 15–45)
RBC Count, CSF: 46 cells/uL — ABNORMAL HIGH
Segmented Neutrophils-CSF: 0 % (ref 0–6)
WBC, CSF: 52 cells/uL — ABNORMAL HIGH (ref 0–5)

## 2021-02-19 LAB — FUNGUS CULTURE W SMEAR
CULTURE:: NO GROWTH
MICRO NUMBER:: 12403908
SMEAR:: NONE SEEN
SPECIMEN QUALITY:: ADEQUATE

## 2021-02-19 LAB — CSF CULTURE W GRAM STAIN
MICRO NUMBER:: 12403909
Result:: NO GROWTH
SPECIMEN QUALITY:: ADEQUATE

## 2021-02-19 LAB — ANGIOTENSIN CONVERTING ENZYME, CSF: ANGIOTENSIN CONVERTING ENZYME ( ACE) CSF: 7 U/L (ref <=15)

## 2021-02-19 LAB — PROTEIN, CSF: Total Protein, CSF: 104 mg/dL — ABNORMAL HIGH (ref 15–45)

## 2021-02-19 LAB — GLUCOSE, CSF: Glucose, CSF: 54 mg/dL (ref 40–80)

## 2021-02-23 ENCOUNTER — Other Ambulatory Visit: Payer: Self-pay | Admitting: Diagnostic Neuroimaging

## 2021-02-23 ENCOUNTER — Telehealth: Payer: Self-pay | Admitting: *Deleted

## 2021-02-23 ENCOUNTER — Inpatient Hospital Stay: Payer: Managed Care, Other (non HMO) | Attending: Internal Medicine

## 2021-02-23 DIAGNOSIS — R9089 Other abnormal findings on diagnostic imaging of central nervous system: Secondary | ICD-10-CM

## 2021-02-23 NOTE — Progress Notes (Signed)
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 LP slightly abnormal (WBC 52, lymphs; protein 104).    Penni Bombard, MD 77/41/4239, 53:20 PM Certified in Neurology, Neurophysiology and Neuroimaging  [facility] [address] [place], [place] [address] [phone]

## 2021-02-23 NOTE — Telephone Encounter (Signed)
I called patient. I am referring to neuro-oncology consult .-VRP

## 2021-02-24 ENCOUNTER — Ambulatory Visit: Payer: Managed Care, Other (non HMO) | Admitting: Cardiology

## 2021-02-24 ENCOUNTER — Encounter: Payer: Self-pay | Admitting: Cardiology

## 2021-02-24 ENCOUNTER — Other Ambulatory Visit: Payer: Self-pay

## 2021-02-24 VITALS — BP 110/78 | HR 57 | Ht 69.0 in | Wt 227.4 lb

## 2021-02-24 DIAGNOSIS — I10 Essential (primary) hypertension: Secondary | ICD-10-CM | POA: Diagnosis not present

## 2021-02-24 DIAGNOSIS — R55 Syncope and collapse: Secondary | ICD-10-CM

## 2021-02-24 DIAGNOSIS — R002 Palpitations: Secondary | ICD-10-CM | POA: Diagnosis not present

## 2021-02-24 NOTE — Patient Instructions (Signed)

## 2021-02-25 ENCOUNTER — Telehealth: Payer: Self-pay | Admitting: Internal Medicine

## 2021-02-25 NOTE — Telephone Encounter (Signed)
Scheduled appt per 10/24 referral. Pt is aware of appt date and time.

## 2021-03-09 ENCOUNTER — Encounter: Payer: Self-pay | Admitting: Internal Medicine

## 2021-03-09 ENCOUNTER — Inpatient Hospital Stay: Payer: Managed Care, Other (non HMO) | Attending: Internal Medicine | Admitting: Internal Medicine

## 2021-03-09 ENCOUNTER — Other Ambulatory Visit: Payer: Self-pay

## 2021-03-09 VITALS — BP 149/93 | HR 72 | Temp 96.9°F | Resp 18 | Wt 226.4 lb

## 2021-03-09 DIAGNOSIS — G939 Disorder of brain, unspecified: Secondary | ICD-10-CM | POA: Insufficient documentation

## 2021-03-09 DIAGNOSIS — G9389 Other specified disorders of brain: Secondary | ICD-10-CM | POA: Insufficient documentation

## 2021-03-09 DIAGNOSIS — R55 Syncope and collapse: Secondary | ICD-10-CM | POA: Diagnosis not present

## 2021-03-09 DIAGNOSIS — M5481 Occipital neuralgia: Secondary | ICD-10-CM

## 2021-03-09 MED ORDER — LEVETIRACETAM 500 MG PO TABS: 500.0000 mg | ORAL_TABLET | Freq: Two times a day (BID) | ORAL | 1 refills | Status: DC

## 2021-03-09 NOTE — Progress Notes (Signed)
[facility] [place], [place] [address] [phone]   New Patient Evaluation  Date of Service: 03/09/21 Patient Name: Sean Carrillo. Patient MRN: [id number] Patient DOB: 08/10/1965 Provider: Ventura Sellers, MD  Identifying Statement:  Sean Carrillo. is a 55 y.o. male with  skull base   mass lesions  who presents for initial consultation and evaluation.    Referring Provider: Trey Sailors, [place] [address] [place],  [place] [address]  Oncologic History 10/24/20: Presents with syncope episode 12/24/20: Brain MRI demonstrates dural based enhancing lesions within base of skull  Biomarkers:  MGMT Unknown.  IDH 1/2 Unknown.  EGFR Unknown  TERT Unknown   History of Present Illness: The patient's records from the referring physician were obtained and reviewed and the patient interviewed to confirm this HPI.  Sean Carrillo. presented to neurologic attention in June 2022 with an episode of loss of consciousness.  This was described as "sweaty, tunnel vision, lost of awareness for brief period".  MRI was performed as part of workup 2 months later, which demonstrated multiple masses within the base of the skull.  Since that time, he has not had recurrence of LOC episodes.  That said, he does describe occasional palpitations, and also separate episodes of "feeling funny, kind of like before I went out, but without the blackout".  Frequency is unclear, possibly weekly. Workup has included multiple MRIs, CSF analysis.  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     Medications: Current Outpatient Medications on File Prior to Visit  Medication Sig Dispense Refill   carvedilol (COREG) 6.25 MG tablet Take 1 tablet (6.25 mg total) by mouth 2 (two) times daily. 30 tablet 1   rosuvastatin (CRESTOR) 40 MG tablet Take 40 mg by mouth daily.     traZODone (DESYREL) 50 MG tablet Take 1 tablet (50 mg  total) by mouth at bedtime as needed for sleep. 90 tablet 1   No current facility-administered medications on file prior to visit.    Allergies: No Known Allergies Past Medical History:  Past Medical History:  Diagnosis Date   Hyperlipidemia    Hypertension    Past Surgical History:  Past Surgical History:  Procedure Laterality Date   Arthroscopic knee surgery     CHOLECYSTECTOMY     Social History:  Social History   Socioeconomic History   Marital status: Married    Spouse name: [name]   Number of children: 2   Years of education: Not on file   Highest education level: Some college, no degree  Occupational History   Not on file  Tobacco Use   Smoking status: Never   Smokeless tobacco: Never  Vaping Use   Vaping Use: Never used  Substance and Sexual Activity   Alcohol use: Never   Drug use: Never   Sexual activity: Not on file  Other Topics Concern   Not on file  Social History Narrative   Lives with wife   Caffeine- rare   Social Determinants of Health   Financial Resource Strain: Not on file  Food Insecurity: Not on file  Transportation Needs: Not on file  Physical Activity: Not on file  Stress: Not on file  Social Connections: Not on file  Intimate Partner Violence: Not on file   Family History:  Family History  Problem Relation Age of Onset   Hypertension Mother    Heart attack Father  Age 50    Review of Systems: Constitutional: Doesn't report fevers, chills or abnormal weight loss Eyes: Doesn't report blurriness of vision Ears, nose, mouth, throat, and face: Doesn't report sore throat Respiratory: Doesn't report cough, dyspnea or wheezes Cardiovascular: Doesn't report palpitation, chest discomfort  Gastrointestinal:  Doesn't report nausea, constipation, diarrhea GU: Doesn't report incontinence Skin: Doesn't report skin rashes Neurological: Per HPI Musculoskeletal: Doesn't report joint pain Behavioral/Psych: Doesn't report  anxiety  Physical Exam: Vitals:   03/09/21 1134  BP: (!) 149/93  Pulse: 72  Resp: 18  Temp: (!) 96.9 F (36.1 C)  SpO2: 98%   KPS: 90. General: Alert, cooperative, pleasant, in no acute distress Head: Normal EENT: No conjunctival injection or scleral icterus.  Lungs: Resp effort normal Cardiac: Regular rate Abdomen: Non-distended abdomen Skin: No rashes cyanosis or petechiae. Extremities: No clubbing or edema  Neurologic Exam: Mental Status: Awake, alert, attentive to examiner. Oriented to self and environment. Language is fluent with intact comprehension.  Cranial Nerves: Visual acuity is grossly normal. Visual fields are full. Extra-ocular movements intact. No ptosis. Face is symmetric Motor: Tone and bulk are normal. Power is full in both arms and legs. Reflexes are symmetric, no pathologic reflexes present.  Sensory: Intact to light touch Gait: Normal.   Labs: I have reviewed the data as listed    Component Value Date/Time   NA 138 10/24/2020 2217   K 4.2 10/24/2020 2217   CL 105 10/24/2020 2217   CO2 26 10/24/2020 2217   GLUCOSE 100 (H) 10/24/2020 2217   BUN 15 10/24/2020 2217   CREATININE 1.23 10/24/2020 2217   CALCIUM 9.2 10/24/2020 2217   GFRNONAA >60 10/24/2020 2217   Lab Results  Component Value Date   WBC 5.1 10/24/2020   NEUTROABS 3.2 10/24/2020   HGB 13.7 10/24/2020   HCT 41.7 10/24/2020   MCV 90.1 10/24/2020   PLT 222 10/24/2020    Imaging:  MR BRAIN W WO CONTRAST  Result Date: 02/19/2021 [facility] STUDY DATE: 02/16/21 PATIENT NAME: Sean Carrillo. DOB: July 18, 1965 MRN: [id number] ORDERING CLINICIAN: Penni Bombard, MD CLINICAL HISTORY: 55 year old male with abnormal MRI. Follow up study. EXAM: MR BRAIN W WO CONTRAST TECHNIQUE: MRI of the brain with and without contrast was obtained utilizing 5 mm axial slices with T1, T2, T2 flair, SWI and diffusion weighted views.  T1 sagittal, T2 coronal  and postcontrast views in the axial and coronal plane were obtained. CONTRAST: 66m multihance COMPARISON: 12/23/20 [facility] [address](1.5 Tesla MRI)  FINDINGS: Stable, multilobulated dural based lesions mainly in the suprasellar region extending bilaterally and inferiorly along the cavernous sinuses and encasing the bilateral internal carotid arteries. The pituitary gland appears to be in continuity these regions of abnormal enhancement. Enhancing tissue noted extending inferiorly into the prepontine cisterns and bilateral cerebellopontine angles, as well as inferiorly to the level of the medulla on the left side. These foci of enhancing material appear to be dural based and extra-axial.    On sagittal views the posterior fossa and corpus callosum are unremarkable.    No abnormal lesions are seen on diffusion-weighted views to suggest acute ischemia. The cortical sulci, fissures and cisterns are normal in size and appearance. Lateral, third and fourth ventricle are normal in size and appearance. No extra-axial fluid collections are seen. No evidence of mass effect or midline shift.  Chronic lacunar ischemic infarcts in the left head of caudate and putamen.  No evidence of intracranial hemorrhage on SWI views. The orbits and their contents, paranasal sinuses and calvarium are unremarkable.  Intracranial flow voids are present.   MRI brain (with and without) demonstrating: - Stable foci of dural based, enhancing tissue extending from the pituitary gland on the right side, along the cavernous sinuses and inferiorly into the cerebellopontine angles and inferiorly to the level of the medulla on the left side. Considerations would include multifocal meningiomas, granulomatous diseases such as sarcoidosis, or other lymphoproliferative disorders. - Chronic lacunar ischemic infarcts in the left head of caudate and putamen. - No significant change from 12/23/20 MRI. 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  The differential remains broader than typical meningioma presentation, and does include inflammatory etiologies.  Notably, CSF ACE level was normal, which lowers suspicion for neurosarcoidosis.   For suspected meningioma, we would recommend biopsy in the near term given diagnostic uncertainty.  However, it would also be reasonable to evaluate further with additional MRI brain.  We would recommend repeating MRI in January 2023 if he prefers to defer biopsy/surgery.  Episodes of LOC are reported as syncope in the medical record.  He has been referred to and worked up by cardiology as such.  However, it appears he is having stereotypical prodrome/aura which is separate from cardiac symptoms and absent any lightheaded feeling.  There is at least some suspicion for focal epilepsy, even if lower on the differential.  We discussed trial of AED, Keppra 524m BID; he will assess frequency of auras on Keppra and compare to prior to treatment.  We will do this for 1 month, then evaluate response to therapy via phone.  We spent twenty additional minutes teaching regarding the natural history, biology, and historical experience in the treatment of brain tumors. We then discussed in detail the current recommendations for therapy focusing on the mode of administration, mechanism of action, anticipated toxicities, and quality of life issues associated with this plan. We also provided teaching sheets for the patient to take home as an additional resource.  All questions were answered. The patient knows to call the  clinic with any problems, questions or concerns. No barriers to learning were detected.  The total time spent in the encounter was 60 minutes and more than 50% was on counseling and review of test results   ZVentura Sellers MD Medical Director of [facility] [place]11/07/22 9:32 AM

## 2021-03-10 NOTE — Addendum Note (Signed)
Addended by: Ventura Sellers on: 03/10/2021 09:42 AM   Modules accepted: Orders

## 2021-04-13 ENCOUNTER — Inpatient Hospital Stay: Payer: Managed Care, Other (non HMO) | Attending: Internal Medicine | Admitting: Internal Medicine

## 2021-04-13 DIAGNOSIS — G9389 Other specified disorders of brain: Secondary | ICD-10-CM | POA: Diagnosis not present

## 2021-04-13 NOTE — Progress Notes (Signed)
I connected with Sean Carrillo. on 04/13/21 at 10:30 AM EST by telephone visit and verified that I am speaking with the correct person using two identifiers.  I discussed the limitations, risks, security and privacy concerns of performing an evaluation and management service by telemedicine and the availability of in-person appointments. I also discussed with the patient that there may be a patient responsible charge related to this service. The patient expressed understanding and agreed to proceed.  Other persons participating in the visit and their role in the encounter:  n/a  Patient's location:  Home  Provider's location:  Office  Chief Complaint:  Brain mass - Plan: MR BRAIN W WO CONTRAST  History of Present Ilness: Sean Carrillo. Describes no meaningful change in episodes of dizziness, pre-syncope, since starting the Keppra.  They tend to occur only when he is driving long distances, he is able to pull over and collect himself.  The medicine has caused some nausea. Observations: Language and cognition at baseline Assessment and Plan: Brain mass - Plan: MR BRAIN W WO CONTRAST  These episodes are not clearly epileptic, so we will continue to defer to cardiology and PCP regarding further workup.   He may discontinue AED due to poor efficacy, lack of clear neurologic etiology of pre-syncopal episodes.   We will need repeat brain MRI to stratify for potential biopsy, recommending scan in 1 month  Follow Up Instructions: RTC after MRI in January.  I discussed the assessment and treatment plan with the patient.  The patient was provided an opportunity to ask questions and all were answered.  The patient agreed with the plan and demonstrated understanding of the instructions.    The patient was advised to call back or seek an in-person evaluation if the symptoms worsen or if the condition fails to improve as anticipated.  I provided 5-10 minutes of non-face-to-face time during  this enocunter.  Ventura Sellers, MD   I provided 15 minutes of non face-to-face telephone visit time during this encounter, and > 50% was spent counseling as documented under my assessment & plan.

## 2021-04-14 ENCOUNTER — Telehealth: Payer: Self-pay | Admitting: Internal Medicine

## 2021-04-14 NOTE — Telephone Encounter (Signed)
Scheduled per 12/12 los, pt has been called and confirmed appt

## 2021-04-21 ENCOUNTER — Other Ambulatory Visit: Payer: Self-pay | Admitting: Radiation Therapy

## 2021-05-20 ENCOUNTER — Telehealth: Payer: Self-pay

## 2021-05-20 NOTE — Telephone Encounter (Signed)
Patient called returned Maudie Mercury RN's call. Reviewed upcoming scan and MD visit appointments with patient. No further questions.

## 2021-05-22 ENCOUNTER — Encounter (HOSPITAL_COMMUNITY): Payer: Self-pay

## 2021-05-22 ENCOUNTER — Ambulatory Visit (HOSPITAL_COMMUNITY): Payer: Managed Care, Other (non HMO)

## 2021-05-25 ENCOUNTER — Inpatient Hospital Stay: Payer: Managed Care, Other (non HMO) | Admitting: Internal Medicine

## 2021-05-25 ENCOUNTER — Inpatient Hospital Stay: Payer: Managed Care, Other (non HMO)

## 2021-05-31 ENCOUNTER — Ambulatory Visit
Admission: RE | Admit: 2021-05-31 | Discharge: 2021-05-31 | Disposition: A | Discharge status: Home / Self Care | Payer: Managed Care, Other (non HMO) | Source: Ambulatory Visit | Attending: Internal Medicine | Admitting: Internal Medicine

## 2021-05-31 ENCOUNTER — Other Ambulatory Visit: Payer: Self-pay

## 2021-05-31 DIAGNOSIS — G9389 Other specified disorders of brain: Secondary | ICD-10-CM

## 2021-05-31 MED ORDER — GADOBENATE DIMEGLUMINE 529 MG/ML IV SOLN
20.0000 mL | Freq: Once | INTRAVENOUS | Status: AC | PRN
  Administered 2021-05-31: 20 mL via INTRAVENOUS

## 2021-06-02 ENCOUNTER — Other Ambulatory Visit: Payer: Self-pay

## 2021-06-02 ENCOUNTER — Inpatient Hospital Stay: Payer: Managed Care, Other (non HMO) | Attending: Internal Medicine | Admitting: Internal Medicine

## 2021-06-02 VITALS — BP 153/97 | HR 60 | Temp 98.1°F | Resp 18 | Ht 69.0 in | Wt 227.4 lb

## 2021-06-02 DIAGNOSIS — I10 Essential (primary) hypertension: Secondary | ICD-10-CM | POA: Insufficient documentation

## 2021-06-02 DIAGNOSIS — M5481 Occipital neuralgia: Secondary | ICD-10-CM

## 2021-06-02 DIAGNOSIS — R519 Headache, unspecified: Secondary | ICD-10-CM | POA: Diagnosis not present

## 2021-06-02 DIAGNOSIS — G9389 Other specified disorders of brain: Secondary | ICD-10-CM | POA: Diagnosis present

## 2021-06-02 MED ORDER — DIVALPROEX SODIUM 500 MG PO DR TAB: 500.0000 mg | DELAYED_RELEASE_TABLET | Freq: Two times a day (BID) | ORAL | 3 refills | Status: DC

## 2021-06-02 NOTE — Progress Notes (Signed)
[facility] [place],  [phone]   Interval Evaluation  Date of Service: 06/02/21 Patient Name: Sean Carrillo. Patient MRN: [id number] Patient DOB: [birth year] Provider: Ventura Sellers, MD  Identifying Statement:  Sean Carrillo. is a 56 y.o. 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  IDH 1/2 Unknown.  EGFR Unknown  TERT Unknown   Interval History: [name]. Presents today for follow up after recent MRI brain.  Today he describes ongoing issues with headaches.  Symptoms are localized to back of neck and head, often wake him up in the morning.  He has been taking vanquish pills daily, for a time period he describes as several years.  No other associated neurologic deficits, no migrainous features, though pain can last for several hours.  Continues to experience occasional bouts of near passing out, Keppra had not helped previously.    H+P (03/09/21) Patient presented to neurologic attention in June 2022 with an episode of loss of consciousness.  This was described as "sweaty, tunnel vision, lost of awareness for brief period".  MRI was performed as part of workup 2 months later, which demonstrated multiple masses within the base of the skull.  Since that time, he has not had recurrence of LOC episodes.  That said, he does describe occasional palpitations, and also separate episodes of "feeling funny, kind of like before I went out, but without the blackout".  Frequency is unclear, possibly weekly. Workup has included multiple MRIs, CSF analysis.  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     Medications: Current Outpatient Medications on File Prior to Visit  Medication Sig Dispense Refill    ASA-APAP-Caff Buffered (VANQUISH) [phone] MG TABS Take 2-3 tablets by mouth daily as needed.     carvedilol (COREG) 6.25 MG tablet Take 1 tablet (6.25 mg total) by mouth 2 (two) times daily. 30 tablet 1   rosuvastatin (CRESTOR) 40 MG tablet Take 40 mg by mouth daily.     traZODone (DESYREL) 50 MG tablet Take 1 tablet (50 mg total) by mouth at bedtime as needed for sleep. 90 tablet 1   No current facility-administered medications on file prior to visit.    Allergies: No Known Allergies Past Medical History:  Past Medical History:  Diagnosis Date   Hyperlipidemia    Hypertension    Past Surgical History:  Past Surgical History:  Procedure Laterality Date   Arthroscopic knee surgery     CHOLECYSTECTOMY     Social History:  Social History   Socioeconomic History   Marital status: Married    Spouse name: [name]   Number of children: 2   Years of education: Not on file   Highest education level: Some college, no degree  Occupational History   Not on file  Tobacco Use   Smoking status: Never   Smokeless tobacco: Never  Vaping Use   Vaping Use: Never used  Substance and Sexual Activity   Alcohol use: Never   Drug use: Never   Sexual activity: Not on file  Other Topics Concern   Not on file  Social History Narrative   Lives with wife   Caffeine- rare   Social Determinants of Health   Financial Resource Strain: Not on  file  Food Insecurity: Not on file  Transportation Needs: Not on file  Physical Activity: Not on file  Stress: Not on file  Social Connections: Not on file  Intimate Partner Violence: Not on file   Family History:  Family History  Problem Relation Age of Onset   Hypertension Mother    Heart attack Father        Age 4    Review of Systems: Constitutional: Doesn't report fevers, chills or abnormal weight loss Eyes: Doesn't report blurriness of vision Ears, nose, mouth, throat, and face: Doesn't report sore throat Respiratory: Doesn't report  cough, dyspnea or wheezes Cardiovascular: Doesn't report palpitation, chest discomfort  Gastrointestinal:  Doesn't report nausea, constipation, diarrhea GU: Doesn't report incontinence Skin: Doesn't report skin rashes Neurological: Per HPI Musculoskeletal: Doesn't report joint pain Behavioral/Psych: Doesn't report anxiety  Physical Exam: Vitals:   06/02/21 1241  BP: (!) 153/97  Pulse: 60  Resp: 18  Temp: 98.1 F (36.7 C)  SpO2: 97%   KPS: 90. General: Alert, cooperative, pleasant, in no acute distress Head: Normal EENT: No conjunctival injection or scleral icterus.  Lungs: Resp effort normal Cardiac: Regular rate Abdomen: Non-distended abdomen Skin: No rashes cyanosis or petechiae. Extremities: No clubbing or edema  Neurologic Exam: Mental Status: Awake, alert, attentive to examiner. Oriented to self and environment. Language is fluent with intact comprehension.  Cranial Nerves: Visual acuity is grossly normal. Visual fields are full. Extra-ocular movements intact. No ptosis. Face is symmetric Motor: Tone and bulk are normal. Power is full in both arms and legs. Reflexes are symmetric, no pathologic reflexes present.  Sensory: Intact to light touch Gait: Normal.   Labs: I have reviewed the data as listed    Component Value Date/Time   NA 138 10/24/2020 2217   K 4.2 10/24/2020 2217   CL 105 10/24/2020 2217   CO2 26 10/24/2020 2217   GLUCOSE 100 (H) 10/24/2020 2217   BUN 15 10/24/2020 2217   CREATININE 1.23 10/24/2020 2217   CALCIUM 9.2 10/24/2020 2217   GFRNONAA >60 10/24/2020 2217   Lab Results  Component Value Date   WBC 5.1 10/24/2020   NEUTROABS 3.2 10/24/2020   HGB 13.7 10/24/2020   HCT 41.7 10/24/2020   MCV 90.1 10/24/2020   PLT 222 10/24/2020    Imaging:  MR BRAIN W WO CONTRAST  Result Date: 06/01/2021 CLINICAL DATA:  Brain/CNS neoplasm, assess treatment response. Syncope. Suprasellar mass. EXAM: MRI HEAD WITHOUT AND WITH CONTRAST TECHNIQUE:  Multiplanar, multiecho pulse sequences of the brain and surrounding structures were obtained without and with intravenous contrast. CONTRAST:  61m MULTIHANCE GADOBENATE DIMEGLUMINE 529 MG/ML IV SOLN COMPARISON:  02/16/2021 FINDINGS: Brain: There is no evidence of an acute infarct, midline shift, or extra-axial fluid collection. The ventricles and sulci are normal. A chronic left basal ganglia infarct with chronic blood products is unchanged. A homogeneously, avidly enhancing extra-axial/dural-based bilateral parasellar region mass is unchanged. This is again noted to asymmetrically involve the right suprasellar cistern and mildly extend into the right lateral aspect of the sella, however a normal pituitary gland is seen separate from the mass. The pituitary infundibulum is mildly deviated leftward. The mass extends anteriorly along the cavernous sinuses and anterior clinoid processes and encases the supraclinoid internal carotid arteries bilaterally without evidence of significant arterial narrowing. Nodular enhancement extends into Meckel's caves bilaterally. The mass extends posteriorly into the prepontine cistern and along the under surface and medial aspects of the tentorium bilaterally, and there is also extension inferiorly  along the dorsal aspect of the clivus. There is left greater than right cerebellopontine angle cistern involvement there is mild mass effect on the lateral aspects of the pons without associated brain edema. The mass measures 4 cm in transverse diameter in the parasellar region. A separate 0.8 cm enhancing dural-based mass in the posterior fossa lateral to the medulla on the left is unchanged as well (series 17, image 32). Vascular: Major intracranial vascular flow voids are preserved. Skull and upper cervical spine: Unremarkable bone marrow signal. Sinuses/Orbits: Unremarkable orbits. Subcentimeter mucous retention cyst in the left maxillary sinus. Clear mastoid air cells. Other: None.  IMPRESSION: 1. Unchanged large central skull base mass in separate smaller mass more inferiorly in the posterior fossa suspicious for meningiomas. Other considerations include non-neoplastic processes such as sarcoidosis and IgG4 related disease as well as lymphoma. 2. Chronic left basal ganglia infarct. Electronically Signed   By: Logan Bores M.D.   On: 06/01/2021 08:40     Assessment/Plan Brain mass  Tony Estella Husk.. presents today with clinical syndrome consistent with occipital neuralgia.  Brain MRI is stable, findings consistent with skull base meningioma.    Recommended continued MRI surveillance with repeat imaging in 6 months; he is agreeable with this plan.  For headaches, recommended referral to Dr. Davy Pique for greater occipital nerve blockade.  We counseled him on medication overuse headache.    Episodes of LOC still could be epileptic in nature.  We recommended re-challenge, this time with Depakote 564m BID, which could also help with headache prevention.  We will give him a call in ~1 month to assess response to these interventions.   We also ask that BRandon Goldsmith return to in person clinic in 6 months following next brain MRI, or sooner as needed.  All questions were answered. The patient knows to call the clinic with any problems, questions or concerns. No barriers to learning were detected.  The total time spent in the encounter was 40 minutes and more than 50% was on counseling and review of test results   ZVentura Sellers MD Medical Director of Neuro-Oncology [facility]at [facility]01/31/23 1:49 PM

## 2021-06-04 ENCOUNTER — Telehealth: Payer: Self-pay | Admitting: Internal Medicine

## 2021-06-04 NOTE — Telephone Encounter (Signed)
Sch per 2/2 inbasket, pt aware  °

## 2021-06-04 NOTE — Progress Notes (Signed)
Routed referral to Dr Davy Pique for Nerve Block via Epic interface routing system.

## 2021-06-10 ENCOUNTER — Telehealth: Payer: Self-pay | Admitting: Internal Medicine

## 2021-06-10 NOTE — Telephone Encounter (Signed)
Sch per 2/2 inbasket, pt aware  °

## 2021-07-02 ENCOUNTER — Inpatient Hospital Stay: Payer: 59 | Attending: Internal Medicine | Admitting: Internal Medicine

## 2021-07-02 DIAGNOSIS — G9389 Other specified disorders of brain: Secondary | ICD-10-CM | POA: Diagnosis not present

## 2021-07-02 NOTE — Progress Notes (Signed)
I connected with Sean Carrillo. on 07/02/21 at 12:30 PM EST by telephone visit and verified that I am speaking with the correct person using two identifiers.  ?I discussed the limitations, risks, security and privacy concerns of performing an evaluation and management service by telemedicine and the availability of in-person appointments. I also discussed with the patient that there may be a patient responsible charge related to this service. The patient expressed understanding and agreed to proceed.  ?Other persons participating in the visit and their role in the encounter:  n/a  ?Patient's location:  Home  ?Provider's location:  Office  ?Chief Complaint:  Brain mass ? ?History of Present Ilness: Sean Carrillo. describes no significant changes today.  He has not started dosing the depakote as discussed at prior visit.  Is using less of the vanquish for his headaches, less than daily.  No frank seizures. ?Observations: Language and cognition at baseline ?Assessment and Plan: Brain mass ? ?Clinically stable.  Discussed again a trial of depakote for headaches and questionable alteration of consciousness episodes.  He is agreeable with this. ?  ?Follow Up Instructions: We ask that Sean Carrillo. return to clinic in 4 months following next brain MRI, or sooner as needed. ? ?I discussed the assessment and treatment plan with the patient.  The patient was provided an opportunity to ask questions and all were answered.  The patient agreed with the plan and demonstrated understanding of the instructions.   ? ?The patient was advised to call back or seek an in-person evaluation if the symptoms worsen or if the condition fails to improve as anticipated.  I provided 5-10 minutes of non-face-to-face time during this enocunter. ? ?Ventura Sellers, MD ? ? ?I provided 15 minutes of non face-to-face telephone visit time during this encounter, and > 50% was spent counseling as documented under my  assessment & plan.  ? ?

## 2021-07-03 ENCOUNTER — Telehealth: Payer: Self-pay | Admitting: Internal Medicine

## 2021-07-03 NOTE — Telephone Encounter (Signed)
Scheduled per 3/2 los, pt has been called and confirmed appt ? ?

## 2021-07-13 ENCOUNTER — Other Ambulatory Visit: Payer: Self-pay

## 2021-07-13 ENCOUNTER — Emergency Department (HOSPITAL_BASED_OUTPATIENT_CLINIC_OR_DEPARTMENT_OTHER)
Admission: EM | Admit: 2021-07-13 | Discharge: 2021-07-13 | Disposition: A | Discharge status: Home / Self Care | Payer: No Typology Code available for payment source | Attending: Emergency Medicine | Admitting: Emergency Medicine

## 2021-07-13 ENCOUNTER — Encounter (HOSPITAL_BASED_OUTPATIENT_CLINIC_OR_DEPARTMENT_OTHER): Payer: Self-pay

## 2021-07-13 ENCOUNTER — Emergency Department (HOSPITAL_BASED_OUTPATIENT_CLINIC_OR_DEPARTMENT_OTHER): Payer: No Typology Code available for payment source

## 2021-07-13 DIAGNOSIS — M5431 Sciatica, right side: Secondary | ICD-10-CM | POA: Diagnosis not present

## 2021-07-13 DIAGNOSIS — Z79899 Other long term (current) drug therapy: Secondary | ICD-10-CM | POA: Diagnosis not present

## 2021-07-13 DIAGNOSIS — M5441 Lumbago with sciatica, right side: Secondary | ICD-10-CM

## 2021-07-13 DIAGNOSIS — M545 Low back pain, unspecified: Secondary | ICD-10-CM | POA: Diagnosis present

## 2021-07-13 MED ORDER — MORPHINE SULFATE (PF) 4 MG/ML IV SOLN
4.0000 mg | Freq: Once | INTRAVENOUS | Status: AC
  Administered 2021-07-13: 4 mg via INTRAMUSCULAR
  Filled 2021-07-13: qty 1

## 2021-07-13 MED ORDER — DEXAMETHASONE SODIUM PHOSPHATE 10 MG/ML IJ SOLN
10.0000 mg | Freq: Once | INTRAMUSCULAR | Status: AC
  Administered 2021-07-13: 10 mg via INTRAMUSCULAR
  Filled 2021-07-13: qty 1

## 2021-07-13 MED ORDER — CYCLOBENZAPRINE HCL 10 MG PO TABS: 10.0000 mg | ORAL_TABLET | Freq: Two times a day (BID) | ORAL | 0 refills | Status: DC | PRN

## 2021-07-13 MED ORDER — LIDOCAINE 5 % EX PTCH: 1.0000 | MEDICATED_PATCH | CUTANEOUS | 0 refills | Status: DC

## 2021-07-13 NOTE — ED Triage Notes (Addendum)
Pt arrives with c/o right lower back pain with radiation into hip X3-4 days. Pt reports moving some furniture a few days ago. States he has been using OTC medications and rubs.  ?

## 2021-07-13 NOTE — ED Provider Notes (Signed)
?[facility] ?Provider Note ? ? ?CSN: [id number] ?Arrival date & time: 07/13/21  1539 ? ?  ? ?History ? ?Chief Complaint  ?Patient presents with  ? Back Pain  ? ? ?Sean Carrillo. is a 56 y.o. male. ? ?Patient presents with right lower back pain.  He says it started 3 to 4 days ago.  It started after he was moving some furniture.  He now has some radiation into his right hip area.  There is no radiation further down the leg.  No numbness or weakness to the legs.  No loss of bowel or bladder control.  No associate abdominal pain.  No nausea or vomiting.  No fevers.  No history of prior back problems in the past.  He says the pain is worse with movement and ambulation.  He also is worse when he is laying on that side. ? ? ? ? ?  ? ?Home Medications ?Prior to Admission medications   ?Medication Sig Start Date End Date Taking? Authorizing Provider  ?cyclobenzaprine (FLEXERIL) 10 MG tablet Take 1 tablet (10 mg total) by mouth 2 (two) times daily as needed for muscle spasms. 07/13/21  Yes Malvin Johns, MD  ?lidocaine (LIDODERM) 5 % Place 1 patch onto the skin daily. Remove & Discard patch within 12 hours or as directed by MD 07/13/21  Yes Malvin Johns, MD  ?ASA-APAP-Caff Buffered [facility]) [phone] MG TABS Take 2-3 tablets by mouth daily as needed.    [provider]  ?carvedilol (COREG) 6.25 MG tablet Take 1 tablet (6.25 mg total) by mouth 2 (two) times daily. 02/04/21   Lelon Perla, MD  ?divalproex (DEPAKOTE) 500 MG DR tablet Take 1 tablet (500 mg total) by mouth 2 (two) times daily. 06/02/21   Ventura Sellers, MD  ?rosuvastatin (CRESTOR) 40 MG tablet Take 40 mg by mouth daily. 02/19/21   [provider]  ?traZODone (DESYREL) 50 MG tablet Take 1 tablet (50 mg total) by mouth at bedtime as needed for sleep. 01/29/21   Dohmeier, Asencion Partridge, MD  ?   ? ?Allergies    ?Patient has no known allergies.   ? ?Review of Systems   ?Review of Systems  ?Constitutional:   Negative for chills, diaphoresis, fatigue and fever.  ?HENT:  Negative for congestion, rhinorrhea and sneezing.   ?Eyes: Negative.   ?Respiratory:  Negative for cough, chest tightness and shortness of breath.   ?Cardiovascular:  Negative for chest pain and leg swelling.  ?Gastrointestinal:  Negative for abdominal pain, blood in stool, diarrhea, nausea and vomiting.  ?Genitourinary:  Negative for difficulty urinating, flank pain, frequency and hematuria.  ?Musculoskeletal:  Positive for back pain. Negative for arthralgias.  ?Skin:  Negative for rash.  ?Neurological:  Negative for dizziness, speech difficulty, weakness, numbness and headaches.  ? ?Physical Exam ?Updated Vital Signs ?BP 125/81 (BP Location: Left Arm)   Pulse 64   Temp 98.1 ?F (36.7 ?C) (Oral)   Resp 18   Ht '5\' 9"'$  (1.753 m)   Wt 104.3 kg   SpO2 96%   BMI 33.97 kg/m?  ?Physical Exam ?Constitutional:   ?   Appearance: He is well-developed.  ?HENT:  ?   Head: Normocephalic and atraumatic.  ?Eyes:  ?   Pupils: Pupils are equal, round, and reactive to light.  ?Cardiovascular:  ?   Rate and Rhythm: Normal rate and regular rhythm.  ?   Heart sounds: Normal heart sounds.  ?Pulmonary:  ?   Effort: Pulmonary  effort is normal. No respiratory distress.  ?   Breath sounds: Normal breath sounds. No wheezing or rales.  ?Chest:  ?   Chest wall: No tenderness.  ?Abdominal:  ?   General: Bowel sounds are normal.  ?   Palpations: Abdomen is soft.  ?   Tenderness: There is no abdominal tenderness. There is no guarding or rebound.  ?Musculoskeletal:     ?   General: Normal range of motion.  ?   Cervical back: Normal range of motion and neck supple.  ?   Comments: Positive tenderness in his right lumbar back region.  No spinal tenderness noted.  No pain along the rib cage.  Positive straight leg raise on the right, negative on the left, pedal pulses are intact, he has normal sensation and motor function distally.  ?Lymphadenopathy:  ?   Cervical: No cervical  adenopathy.  ?Skin: ?   General: Skin is warm and dry.  ?   Findings: No rash.  ?Neurological:  ?   Mental Status: He is alert and oriented to person, place, and time.  ? ? ?ED Results / Procedures / Treatments   ?Labs ?(all labs ordered are listed, but only abnormal results are displayed) ?Labs Reviewed - No data to display ? ?EKG ?None ? ?Radiology ?DG Lumbar Spine Complete ? ?Result Date: 07/13/2021 ?CLINICAL DATA:  Lower back pain.  Right hip pain. EXAM: LUMBAR SPINE - COMPLETE 4+ VIEW COMPARISON:  None. FINDINGS: Normal frontal alignment. There is straightening of the normal lumbar lordosis. No sagittal spondylolisthesis. Vertebral body heights are maintained. Moderate anterior L3-4 and mild anterior T12-L1 endplate osteophytes. Mild posterior L5-S1 disc space narrowing. No pars defect is identified. L5-S1 greater than L4-5 facet joint arthropathy. Cholecystectomy clips. IMPRESSION: Straightening of the normal lumbar lordosis. Very mild degenerative disc and endplate changes. Electronically Signed   By: Yvonne Kendall M.D.   On: 07/13/2021 18:53   ? ?Procedures ?Procedures  ? ? ?Medications Ordered in ED ?Medications  ?morphine (PF) 4 MG/ML injection 4 mg (4 mg Intramuscular Given 07/13/21 1852)  ?dexamethasone (DECADRON) injection 10 mg (10 mg Intramuscular Given 07/13/21 1852)  ? ? ?ED Course/ Medical Decision Making/ A&P ?  ?                        ?Medical Decision Making ?Amount and/or Complexity of Data Reviewed ?Radiology: ordered. ? ?Risk ?Prescription drug management. ? ? ?Patient is a 56 year old male who presents with back pain.  It is on the right side with some radicular symptoms.  He has no neurologic deficits.  No suggestions of cauda equina.  No associated abdominal pain or other symptoms that would be more concerning for AAA.  No urinary symptoms.  He did report that he has a history of a brain tumor.  I researched this with a chart review and it sounds like he has a probable meningioma.  However  he did have because of the x-rays of his lumbosacral spine which showed no bony lesions.  There is some degenerative changes.  This was interpreted by me.  He was given an injection of morphine as well as Decadron.  He was given a prescription for Flexeril and Lidoderm patches.  He was given a referral to follow-up with orthopedics.  Return precautions were given. ? ?Final Clinical Impression(s) / ED Diagnoses ?Final diagnoses:  ?Acute right-sided low back pain with right-sided sciatica  ? ? ?Rx / DC Orders ?ED Discharge Orders   ? ?  Ordered  ?  cyclobenzaprine (FLEXERIL) 10 MG tablet  2 times daily PRN       ? 07/13/21 1949  ?  lidocaine (LIDODERM) 5 %  Every 24 hours       ? 07/13/21 1949  ? ?  ?  ? ?  ? ? ?  ?Malvin Johns, MD ?07/13/21 1952 ? ?

## 2021-07-22 ENCOUNTER — Encounter (HOSPITAL_BASED_OUTPATIENT_CLINIC_OR_DEPARTMENT_OTHER): Payer: Self-pay

## 2021-07-22 ENCOUNTER — Emergency Department (HOSPITAL_BASED_OUTPATIENT_CLINIC_OR_DEPARTMENT_OTHER)
Admission: EM | Admit: 2021-07-22 | Discharge: 2021-07-22 | Disposition: A | Discharge status: Home / Self Care | Payer: No Typology Code available for payment source | Attending: Emergency Medicine | Admitting: Emergency Medicine

## 2021-07-22 ENCOUNTER — Other Ambulatory Visit: Payer: Self-pay

## 2021-07-22 DIAGNOSIS — M5441 Lumbago with sciatica, right side: Secondary | ICD-10-CM | POA: Insufficient documentation

## 2021-07-22 DIAGNOSIS — M545 Low back pain, unspecified: Secondary | ICD-10-CM | POA: Diagnosis present

## 2021-07-22 DIAGNOSIS — X500XXA Overexertion from strenuous movement or load, initial encounter: Secondary | ICD-10-CM | POA: Insufficient documentation

## 2021-07-22 MED ORDER — OXYCODONE HCL 5 MG PO TABS
5.0000 mg | ORAL_TABLET | Freq: Once | ORAL | Status: AC
  Administered 2021-07-22: 5 mg via ORAL
  Filled 2021-07-22: qty 1

## 2021-07-22 MED ORDER — ACETAMINOPHEN 500 MG PO TABS
1000.0000 mg | ORAL_TABLET | Freq: Once | ORAL | Status: AC
  Administered 2021-07-22: 1000 mg via ORAL
  Filled 2021-07-22: qty 2

## 2021-07-22 MED ORDER — KETOROLAC TROMETHAMINE 60 MG/2ML IM SOLN
15.0000 mg | Freq: Once | INTRAMUSCULAR | Status: AC
  Administered 2021-07-22: 15 mg via INTRAMUSCULAR
  Filled 2021-07-22: qty 2

## 2021-07-22 MED ORDER — DIAZEPAM 5 MG PO TABS
5.0000 mg | ORAL_TABLET | Freq: Once | ORAL | Status: AC
  Administered 2021-07-22: 5 mg via ORAL
  Filled 2021-07-22: qty 1

## 2021-07-22 NOTE — Discharge Instructions (Signed)

## 2021-07-22 NOTE — ED Triage Notes (Signed)
Pt c/o lower back pain that radiates down right LE x 2-3 weeks-denies injury-states he was seen here for same-NAD-steady gait ?

## 2021-07-22 NOTE — ED Provider Notes (Signed)
?[facility] EMERGENCY DEPARTMENT ?Provider Note ? ? ?CSN: [id number] ?Arrival date & time: 07/22/21  1530 ? ?  ? ?History ? ?Chief Complaint  ?Patient presents with  ? Back Pain  ? ? ?Sean Carrillo. is a 56 y.o. male. ? ?56 yo M with a chief complaints of right-sided low back pain that radiates down the leg.  This been going on for at least couple weeks.  The patient had been seen in the emergency department at the onset and then had a family doctor's office.  Started after he was moving.  He had some improvement with the medication that was given to him in the emergency department but unfortunately had worsening when he had to do some more moving at home.  He denies loss of bowel or bladder denies also partial sensation denies numbness or weakness to the leg.  Denies fever.  Had a lumbar puncture performed in December but otherwise denies injection to his back. ? ? ?Back Pain ? ?  ? ?Home Medications ?Prior to Admission medications   ?Medication Sig Start Date End Date Taking? Authorizing Provider  ?ASA-APAP-Caff Buffered (VANQUISH) [phone] MG TABS Take 2-3 tablets by mouth daily as needed.    [provider]  ?carvedilol (COREG) 6.25 MG tablet Take 1 tablet (6.25 mg total) by mouth 2 (two) times daily. 02/04/21   Lelon Perla, MD  ?cyclobenzaprine (FLEXERIL) 10 MG tablet Take 1 tablet (10 mg total) by mouth 2 (two) times daily as needed for muscle spasms. 07/13/21   Malvin Johns, MD  ?divalproex (DEPAKOTE) 500 MG DR tablet Take 1 tablet (500 mg total) by mouth 2 (two) times daily. 06/02/21   Ventura Sellers, MD  ?lidocaine (LIDODERM) 5 % Place 1 patch onto the skin daily. Remove & Discard patch within 12 hours or as directed by MD 07/13/21   Malvin Johns, MD  ?rosuvastatin (CRESTOR) 40 MG tablet Take 40 mg by mouth daily. 02/19/21   [provider]  ?traZODone (DESYREL) 50 MG tablet Take 1 tablet (50 mg total) by mouth at bedtime as needed for sleep. 01/29/21    Dohmeier, Asencion Partridge, MD  ?   ? ?Allergies    ?Patient has no known allergies.   ? ?Review of Systems   ?Review of Systems  ?Musculoskeletal:  Positive for back pain.  ? ?Physical Exam ?Updated Vital Signs ?BP (!) 151/96 (BP Location: Left Arm)   Pulse (!) 53   Temp 98 ?F (36.7 ?C) (Oral)   Resp 18   Ht '5\' 9"'$  (1.753 m)   Wt 101.6 kg   SpO2 98%   BMI 33.08 kg/m?  ?Physical Exam ?Vitals and nursing note reviewed.  ?Constitutional:   ?   Appearance: He is well-developed.  ?HENT:  ?   Head: Normocephalic and atraumatic.  ?Eyes:  ?   Pupils: Pupils are equal, round, and reactive to light.  ?Neck:  ?   Vascular: No JVD.  ?Cardiovascular:  ?   Rate and Rhythm: Normal rate and regular rhythm.  ?   Heart sounds: No murmur heard. ?  No friction rub. No gallop.  ?Pulmonary:  ?   Effort: No respiratory distress.  ?   Breath sounds: No wheezing.  ?Abdominal:  ?   General: There is no distension.  ?   Tenderness: There is no abdominal tenderness. There is no guarding or rebound.  ?Musculoskeletal:     ?   General: Normal range of motion.  ?   Cervical  back: Normal range of motion and neck supple.  ?   Comments: Pulse motor and sensation intact to the right lower extremity.  Reflexes are 2+ and equal.  No clonus.  No obvious midline spinal tenderness step-offs or deformities.  He points to his right low back para musculature as area of the pain.  Not reproducible on exam.  Negative straight leg raise test.  Ambulates without issue.  ?Skin: ?   Coloration: Skin is not pale.  ?   Findings: No rash.  ?Neurological:  ?   Mental Status: He is alert and oriented to person, place, and time.  ?Psychiatric:     ?   Behavior: Behavior normal.  ? ? ?ED Results / Procedures / Treatments   ?Labs ?(all labs ordered are listed, but only abnormal results are displayed) ?Labs Reviewed - No data to display ? ?EKG ?None ? ?Radiology ?No results found. ? ?Procedures ?Procedures  ? ? ?Medications Ordered in ED ?Medications  ?acetaminophen (TYLENOL)  tablet 1,000 mg (1,000 mg Oral Given 07/22/21 1604)  ?ketorolac (TORADOL) injection 15 mg (15 mg Intramuscular Given 07/22/21 1606)  ?oxyCODONE (Oxy IR/ROXICODONE) immediate release tablet 5 mg (5 mg Oral Given 07/22/21 1604)  ?diazepam (VALIUM) tablet 5 mg (5 mg Oral Given 07/22/21 1604)  ? ? ?ED Course/ Medical Decision Making/ A&P ?  ?                        ?Medical Decision Making ?Risk ?OTC drugs. ?Prescription drug management. ? ? ?56 yo M with right-sided low back pain after some heavy lifting.  Most likely the patient has right-sided sciatica.  He has a history of a intracranial mass, otherwise has no red flags.  He has a benign exam.  On my record review he has had imaging of his L-spine to screen for metastasis that was clean 3 months ago.  I do not feel that he would benefit from any further imaging today.  I will treat him symptomatically.  Have him follow-up with his family doctor. ? ?4:07 PM:  I have discussed the diagnosis/risks/treatment options with the patient.  Evaluation and diagnostic testing in the emergency department does not suggest an emergent condition requiring admission or immediate intervention beyond what has been performed at this time.  They will follow up with  PCP. We also discussed returning to the ED immediately if new or worsening sx occur. We discussed the sx which are most concerning (e.g., sudden worsening pain, fever, inability to tolerate by mouth, cauda equina s/sx) that necessitate immediate return. Medications administered to the patient during their visit and any new prescriptions provided to the patient are listed below. ? ?Medications given during this visit ?Medications  ?acetaminophen (TYLENOL) tablet 1,000 mg (1,000 mg Oral Given 07/22/21 1604)  ?ketorolac (TORADOL) injection 15 mg (15 mg Intramuscular Given 07/22/21 1606)  ?oxyCODONE (Oxy IR/ROXICODONE) immediate release tablet 5 mg (5 mg Oral Given 07/22/21 1604)  ?diazepam (VALIUM) tablet 5 mg (5 mg Oral Given 07/22/21  1604)  ? ? ? ?The patient appears reasonably screen and/or stabilized for discharge and I doubt any other medical condition or other [facility] requiring further screening, evaluation, or treatment in the ED at this time prior to discharge.  ? ? ? ? ? ? ? ? ?Final Clinical Impression(s) / ED Diagnoses ?Final diagnoses:  ?Acute right-sided low back pain with right-sided sciatica  ? ? ?Rx / DC Orders ?ED Discharge Orders   ? ? None  ? ?  ? ? ?  ?  Deno Etienne, DO ?07/22/21 1607 ? ?

## 2021-10-07 DIAGNOSIS — J0100 Acute maxillary sinusitis, unspecified: Secondary | ICD-10-CM | POA: Diagnosis not present

## 2021-10-14 DIAGNOSIS — E669 Obesity, unspecified: Secondary | ICD-10-CM | POA: Diagnosis not present

## 2021-10-14 DIAGNOSIS — Z131 Encounter for screening for diabetes mellitus: Secondary | ICD-10-CM | POA: Diagnosis not present

## 2021-10-14 DIAGNOSIS — I10 Essential (primary) hypertension: Secondary | ICD-10-CM | POA: Diagnosis not present

## 2021-10-14 DIAGNOSIS — Z0000 Encounter for general adult medical examination without abnormal findings: Secondary | ICD-10-CM | POA: Diagnosis not present

## 2021-10-14 DIAGNOSIS — Z125 Encounter for screening for malignant neoplasm of prostate: Secondary | ICD-10-CM | POA: Diagnosis not present

## 2021-10-14 DIAGNOSIS — D751 Secondary polycythemia: Secondary | ICD-10-CM | POA: Diagnosis not present

## 2021-10-14 DIAGNOSIS — E782 Mixed hyperlipidemia: Secondary | ICD-10-CM | POA: Diagnosis not present

## 2021-10-22 ENCOUNTER — Telehealth: Payer: Self-pay | Admitting: Internal Medicine

## 2021-10-22 NOTE — Telephone Encounter (Signed)
Rescheduled appointment per providers. Patient aware.  ? ?

## 2021-10-26 ENCOUNTER — Inpatient Hospital Stay: Payer: BC Managed Care – PPO | Attending: Internal Medicine | Admitting: Internal Medicine

## 2021-10-26 ENCOUNTER — Other Ambulatory Visit: Payer: Self-pay | Admitting: Internal Medicine

## 2021-10-26 ENCOUNTER — Other Ambulatory Visit: Payer: Self-pay

## 2021-10-26 ENCOUNTER — Inpatient Hospital Stay: Payer: BC Managed Care – PPO

## 2021-10-26 VITALS — BP 149/97 | HR 69 | Temp 97.2°F | Resp 20 | Wt 225.2 lb

## 2021-10-26 DIAGNOSIS — G9389 Other specified disorders of brain: Secondary | ICD-10-CM

## 2021-10-26 DIAGNOSIS — E785 Hyperlipidemia, unspecified: Secondary | ICD-10-CM | POA: Diagnosis not present

## 2021-10-26 DIAGNOSIS — D496 Neoplasm of unspecified behavior of brain: Secondary | ICD-10-CM | POA: Insufficient documentation

## 2021-10-26 DIAGNOSIS — I10 Essential (primary) hypertension: Secondary | ICD-10-CM | POA: Insufficient documentation

## 2021-10-28 ENCOUNTER — Ambulatory Visit (HOSPITAL_COMMUNITY): Payer: BC Managed Care – PPO

## 2021-12-01 ENCOUNTER — Telehealth: Payer: Self-pay | Admitting: *Deleted

## 2021-12-01 ENCOUNTER — Ambulatory Visit: Payer: Managed Care, Other (non HMO) | Admitting: Internal Medicine

## 2021-12-01 NOTE — Telephone Encounter (Signed)
Attempted to reach patient as he is overdue for MRI brain that was ordered by Dr Mickeal Skinner.  Previously patient stated that he has some out of pocket costs that were preventing him from proceeding.  I haven't received a response on a change in that.    Called and left him a message requesting an update on the status of his scheduling. Pending call back.

## 2021-12-06 ENCOUNTER — Ambulatory Visit (HOSPITAL_COMMUNITY)
Admission: RE | Admit: 2021-12-06 | Discharge: 2021-12-06 | Disposition: A | Discharge status: Home / Self Care | Payer: BC Managed Care – PPO | Source: Ambulatory Visit | Attending: Internal Medicine | Admitting: Internal Medicine

## 2021-12-06 DIAGNOSIS — J341 Cyst and mucocele of nose and nasal sinus: Secondary | ICD-10-CM | POA: Diagnosis not present

## 2021-12-06 DIAGNOSIS — G9389 Other specified disorders of brain: Secondary | ICD-10-CM | POA: Insufficient documentation

## 2021-12-06 DIAGNOSIS — E237 Disorder of pituitary gland, unspecified: Secondary | ICD-10-CM | POA: Diagnosis not present

## 2021-12-06 DIAGNOSIS — I6381 Other cerebral infarction due to occlusion or stenosis of small artery: Secondary | ICD-10-CM | POA: Diagnosis not present

## 2021-12-06 MED ORDER — GADOBUTROL 1 MMOL/ML IV SOLN
10.0000 mL | Freq: Once | INTRAVENOUS | Status: AC | PRN
  Administered 2021-12-06: 10 mL via INTRAVENOUS

## 2021-12-17 DIAGNOSIS — R339 Retention of urine, unspecified: Secondary | ICD-10-CM | POA: Diagnosis not present

## 2021-12-17 DIAGNOSIS — R972 Elevated prostate specific antigen [PSA]: Secondary | ICD-10-CM | POA: Diagnosis not present

## 2021-12-22 ENCOUNTER — Other Ambulatory Visit: Payer: Self-pay | Admitting: Radiation Therapy

## 2021-12-22 ENCOUNTER — Inpatient Hospital Stay: Payer: BC Managed Care – PPO | Attending: Internal Medicine | Admitting: Internal Medicine

## 2021-12-22 ENCOUNTER — Other Ambulatory Visit: Payer: Self-pay

## 2021-12-22 VITALS — BP 151/106 | HR 71 | Temp 98.0°F | Resp 16 | Wt 225.4 lb

## 2021-12-22 DIAGNOSIS — D496 Neoplasm of unspecified behavior of brain: Secondary | ICD-10-CM | POA: Diagnosis not present

## 2021-12-22 DIAGNOSIS — I10 Essential (primary) hypertension: Secondary | ICD-10-CM | POA: Insufficient documentation

## 2021-12-22 DIAGNOSIS — R002 Palpitations: Secondary | ICD-10-CM | POA: Insufficient documentation

## 2021-12-22 DIAGNOSIS — G9389 Other specified disorders of brain: Secondary | ICD-10-CM

## 2021-12-22 NOTE — Progress Notes (Unsigned)
[facility] [address], [place] [address] [phone]   Interval Evaluation  Date of Service: 12/22/21 Patient Name: Sean Carrillo. Patient MRN: [id number] Patient DOB: [birth year] Provider: Ventura Sellers, MD  Identifying Statement:  Sean Carrillo. is a 56 y.o. 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  IDH 1/2 Unknown.  EGFR Unknown  TERT Unknown   Interval History: Sean Carrillo. Presents today with cognitive complaints.  He describes occasionally forgetting song lyrics while performing, which is new for him.  Also forgot where he was in a hotel at one point.  Otherwise he remains independent, functional, performing and singing/dancing at a high level.  He does describe one episode of near syncope.  Has not been taking the Depakote as discussed.  Headaches are ok.   H+P (03/09/21) Patient presented to neurologic attention in June 2022 with an episode of loss of consciousness.  This was described as "sweaty, tunnel vision, lost of awareness for brief period".  MRI was performed as part of workup 2 months later, which demonstrated multiple masses within the base of the skull.  Since that time, he has not had recurrence of LOC episodes.  That said, he does describe occasional palpitations, and also separate episodes of "feeling funny, kind of like before I went out, but without the blackout".  Frequency is unclear, possibly weekly. Workup has included multiple MRIs, CSF analysis.  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     Medications: Current Outpatient Medications on File Prior to Visit  Medication Sig Dispense Refill   ASA-APAP-Caff Buffered (VANQUISH) [phone] MG TABS Take 2-3 tablets by mouth daily as needed.      carvedilol (COREG) 6.25 MG tablet Take 1 tablet (6.25 mg total) by mouth 2 (two) times daily. 30 tablet 1   traZODone (DESYREL) 50 MG tablet Take 1 tablet (50 mg total) by mouth at bedtime as needed for sleep. 90 tablet 1   cyclobenzaprine (FLEXERIL) 10 MG tablet Take 1 tablet (10 mg total) by mouth 2 (two) times daily as needed for muscle spasms. (Patient not taking: Reported on 12/22/2021) 20 tablet 0   divalproex (DEPAKOTE) 500 MG DR tablet Take 1 tablet (500 mg total) by mouth 2 (two) times daily. (Patient not taking: Reported on 12/22/2021) 60 tablet 3   lidocaine (LIDODERM) 5 % Place 1 patch onto the skin daily. Remove & Discard patch within 12 hours or as directed by MD (Patient not taking: Reported on 12/22/2021) 30 patch 0   rosuvastatin (CRESTOR) 40 MG tablet Take 40 mg by mouth daily. (Patient not taking: Reported on 12/22/2021)     No current facility-administered medications on file prior to visit.    Allergies: No Known Allergies Past Medical History:  Past Medical History:  Diagnosis Date   Hyperlipidemia    Hypertension    Past Surgical History:  Past Surgical History:  Procedure Laterality Date   Arthroscopic knee surgery     CHOLECYSTECTOMY     Social History:  Social History   Socioeconomic History   Marital status: Married    Spouse name: [name]   Number of children: 2   Years of education: Not on file   Highest education level: Some college, no degree  Occupational  History   Not on file  Tobacco Use   Smoking status: Never   Smokeless tobacco: Never  Vaping Use   Vaping Use: Never used  Substance and Sexual Activity   Alcohol use: Never   Drug use: Never   Sexual activity: Not on file  Other Topics Concern   Not on file  Social History Narrative   Lives with wife   Caffeine- rare   Social Determinants of Health   Financial Resource Strain: Not on file  Food Insecurity: Not on file  Transportation Needs: Not on file  Physical Activity: Not on file   Stress: Not on file  Social Connections: Not on file  Intimate Partner Violence: Not on file   Family History:  Family History  Problem Relation Age of Onset   Hypertension Mother    Heart attack Father        Age 83    Review of Systems: Constitutional: Doesn't report fevers, chills or abnormal weight loss Eyes: Doesn't report blurriness of vision Ears, nose, mouth, throat, and face: Doesn't report sore throat Respiratory: Doesn't report cough, dyspnea or wheezes Cardiovascular: Doesn't report palpitation, chest discomfort  Gastrointestinal:  Doesn't report nausea, constipation, diarrhea GU: Doesn't report incontinence Skin: Doesn't report skin rashes Neurological: Per HPI Musculoskeletal: Doesn't report joint pain Behavioral/Psych: Doesn't report anxiety  Physical Exam: Vitals:   12/22/21 1604  BP: (!) 151/106  Pulse: 71  Resp: 16  Temp: 98 F (36.7 C)  SpO2: 94%   KPS: 90. General: Alert, cooperative, pleasant, in no acute distress Head: Normal EENT: No conjunctival injection or scleral icterus.  Lungs: Resp effort normal Cardiac: Regular rate Abdomen: Non-distended abdomen Skin: No rashes cyanosis or petechiae. Extremities: No clubbing or edema  Neurologic Exam: Mental Status: Awake, alert, attentive to examiner. Oriented to self and environment. Language is fluent with intact comprehension.  Cranial Nerves: Visual acuity is grossly normal. Visual fields are full. Extra-ocular movements intact. No ptosis. Face is symmetric Motor: Tone and bulk are normal. Power is full in both arms and legs. Reflexes are symmetric, no pathologic reflexes present.  Sensory: Intact to light touch Gait: Normal.   Labs: I have reviewed the data as listed    Component Value Date/Time   NA 138 10/24/2020 2217   K 4.2 10/24/2020 2217   CL 105 10/24/2020 2217   CO2 26 10/24/2020 2217   GLUCOSE 100 (H) 10/24/2020 2217   BUN 15 10/24/2020 2217   CREATININE 1.23 10/24/2020  2217   CALCIUM 9.2 10/24/2020 2217   GFRNONAA >60 10/24/2020 2217   Lab Results  Component Value Date   WBC 5.1 10/24/2020   NEUTROABS 3.2 10/24/2020   HGB 13.7 10/24/2020   HCT 41.7 10/24/2020   MCV 90.1 10/24/2020   PLT 222 10/24/2020    Imaging:  [facility] Clinician Interpretation: I have personally reviewed the CNS images as listed.  My interpretation, in the context of the patient's clinical presentation, is progressive disease  MR BRAIN W WO CONTRAST  Result Date: 12/07/2021 CLINICAL DATA:  Provided history: Brain/CNS neoplasm, assess treatment response. EXAM: MRI HEAD WITHOUT AND WITH CONTRAST TECHNIQUE: Multiplanar, multiecho pulse sequences of the brain and surrounding structures were obtained without and with intravenous contrast. CONTRAST:  72m GADAVIST GADOBUTROL 1 MMOL/ML IV SOLN COMPARISON:  Prior brain MRI examinations 05/31/2021 and earlier. FINDINGS: Brain: No age advanced or lobar predominant parenchymal atrophy. Redemonstrated enhancing extra-axial dural-based mass centered within the bilateral parasellar regions, crossing midline. There is asymmetric involvement of  the right aspect of the suprasellar cistern. The mass abuts the pituitary stalk and slightly deviates it to the left. The mass abuts the undersurface of the optic chiasm on the right. The mass extends into the superior aspect of the sella on the right. However, a normal pituitary gland is identified separate from the mass. The mass extends anteriorly along the cavernous sinuses and anterior clinoid processes bilaterally, encasing the supraclinoid internal carotid arteries. Nodular enhancement extends into Meckel's cave, bilaterally. The mass extends posteriorly into the prepontine cistern and along the undersurface and medial aspects of the tentorium, bilaterally. There is slight inferior extension along the clivus. There is left greater than right cerebellopontine angle cistern involvement with mild mass effect upon the  lateral aspects of the pons (without appreciable underlying parenchymal edema). The mass has not significantly changed in extent from the prior brain MRI of 05/31/2021. As before, the mass measures up to 4.2 cm in transverse diameter in the parasellar region. A separate 0.8 cm enhancing extra-axial dural-based mass more inferiorly in the left aspect of the posterior fossa (lateral to the medulla) is also unchanged (for instance as seen on series 16, image 3). Redemonstrated chronic small-vessel infarct with associated chronic blood products in the left basal ganglia. There is no acute infarct. No chronic intracranial blood products. No extra-axial fluid collection. No midline shift. Vascular: Maintained flow voids within the proximal large arterial vessels. Skull and upper cervical spine: No focal suspicious marrow lesion. Sinuses/Orbits: No mass or acute finding within the imaged orbits. Small mucous retention cyst within the left maxillary sinus. IMPRESSION: Unchanged large enhancing dural-based mass centered at the central skull base, and smaller dural-based mass more inferiorly within the left aspect of the posterior fossa. These masses may reflect meningiomas. However, alternative etiologies (such as sarcoidosis, IgG4 related disease, or lymphoma) cannot be excluded. Redemonstrated chronic left basal ganglia lacunar infarct. Small left maxillary sinus mucous retention cyst. Electronically Signed   By: Kellie Simmering D.O.   On: 12/07/2021 12:06     Assessment/Plan Brain mass  Virgle Estella Husk.Marland Kitchen Presents today with modest cognitive complaints.  This could secondary to lifestyle/stressors, or could be from tumor progression.  We recommended moving MRI brain up to ASAP, his last picture was in January.  He will be open to stereotactic biopsy pending results of imaging.  Near LOC episodes, remain suspicious for epileptic etiology.  He agreed to resume the Depakote 538m BID.  We will give him a  call once MRI is complete to review treatment options further.  All questions were answered. The patient knows to call the clinic with any problems, questions or concerns. No barriers to learning were detected.  The total time spent in the encounter was 30 minutes and more than 50% was on counseling and review of test results   ZVentura Sellers MD Medical Director of Neuro-Oncology [facility]08/22/23 4:55 PM

## 2021-12-23 MED ORDER — DIVALPROEX SODIUM 500 MG PO DR TAB: 500.0000 mg | DELAYED_RELEASE_TABLET | Freq: Two times a day (BID) | ORAL | 3 refills | Status: DC

## 2021-12-28 ENCOUNTER — Inpatient Hospital Stay: Payer: BC Managed Care – PPO

## 2022-01-18 DIAGNOSIS — M79671 Pain in right foot: Secondary | ICD-10-CM | POA: Diagnosis not present

## 2022-01-18 DIAGNOSIS — B353 Tinea pedis: Secondary | ICD-10-CM | POA: Diagnosis not present

## 2022-01-18 DIAGNOSIS — L03116 Cellulitis of left lower limb: Secondary | ICD-10-CM | POA: Diagnosis not present

## 2022-01-18 DIAGNOSIS — B351 Tinea unguium: Secondary | ICD-10-CM | POA: Diagnosis not present

## 2022-02-18 DIAGNOSIS — Z6833 Body mass index (BMI) 33.0-33.9, adult: Secondary | ICD-10-CM | POA: Diagnosis not present

## 2022-02-18 DIAGNOSIS — D496 Neoplasm of unspecified behavior of brain: Secondary | ICD-10-CM | POA: Diagnosis not present

## 2022-02-23 DIAGNOSIS — N429 Disorder of prostate, unspecified: Secondary | ICD-10-CM | POA: Diagnosis not present

## 2022-02-23 DIAGNOSIS — R972 Elevated prostate specific antigen [PSA]: Secondary | ICD-10-CM | POA: Diagnosis not present

## 2022-03-01 ENCOUNTER — Other Ambulatory Visit: Payer: Self-pay | Admitting: Neurological Surgery

## 2022-03-04 DIAGNOSIS — N401 Enlarged prostate with lower urinary tract symptoms: Secondary | ICD-10-CM | POA: Diagnosis not present

## 2022-03-04 DIAGNOSIS — N138 Other obstructive and reflux uropathy: Secondary | ICD-10-CM | POA: Diagnosis not present

## 2022-04-01 ENCOUNTER — Telehealth: Payer: Self-pay | Admitting: Cardiology

## 2022-04-01 ENCOUNTER — Telehealth: Payer: Self-pay | Admitting: *Deleted

## 2022-04-01 NOTE — Telephone Encounter (Signed)
Patient c/o Palpitations:  High priority if patient c/o lightheadedness, shortness of breath, or chest pain  How long have you had palpitations/irregular HR/ Afib? Are you having the symptoms now? Since thanksgiving, is having symptoms now   Are you currently experiencing lightheadedness, SOB or CP? No   Do you have a history of afib (atrial fibrillation) or irregular heart rhythm? Yes  Have you checked your BP or HR? (document readings if available): No   Are you experiencing any other symptoms? Feeling he is going to pass out while driving for past 6-7 months.  Patient states the lightheadedness/feeling he is going to pass out while driving was previously only when driving for long periods of time. Since thanksgiving it has been every time he drives even for a short distance. Patient c/o palpitations now, but denies any SOB, CP or lightheadedness at this time. Cancer center also sent Dr. Stanford Breed a message requesting he be scheduled for an appt to be seen regarding this. Please advise.

## 2022-04-01 NOTE — Telephone Encounter (Signed)
Spoke with patient and encouraged him to follow up with Dr Jacalyn Lefevre office to follow up regarding change in cardiac symptoms.

## 2022-04-01 NOTE — Telephone Encounter (Signed)
Patient called with worsening concern.  He is scheduled for surgery in January.  Has a baseline history of palpitations and was prescribed carvediol 6.25 mg BID and that had managed it to some degree however over the past 2-3 weeks patient has had increased heart palpitations, feeling weak and almost lightheaded or passing out, denies actual passing out.  Denies any feeling of impending doom, denies shortness of breath.  States worsens with driving and he is concerned about safety in the area.  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    Last follow up with Cardiology was October 2022.    Patient is unsure if this is cardiology related or stress/anxiety or tumor related.

## 2022-04-01 NOTE — Telephone Encounter (Signed)
Spoke to patient appointment schedule for tomorrow  with K.Lawrence NP 04/02/22.  Patient is aware  Dr Stanford Breed will be in the office if any question arise.  RN recommend patient to have someone drive him to the office visit Patient states he can drive   Address given to patient  aware to be here no later 8 am with medications.

## 2022-04-02 ENCOUNTER — Ambulatory Visit: Payer: BC Managed Care – PPO | Attending: Adult Health | Admitting: Adult Health

## 2022-04-02 ENCOUNTER — Encounter: Payer: Self-pay | Admitting: Adult Health

## 2022-04-02 VITALS — BP 138/98 | HR 80 | Ht 69.0 in | Wt 229.6 lb

## 2022-04-02 DIAGNOSIS — G4733 Obstructive sleep apnea (adult) (pediatric): Secondary | ICD-10-CM

## 2022-04-02 DIAGNOSIS — R002 Palpitations: Secondary | ICD-10-CM

## 2022-04-02 DIAGNOSIS — I10 Essential (primary) hypertension: Secondary | ICD-10-CM

## 2022-04-02 DIAGNOSIS — R55 Syncope and collapse: Secondary | ICD-10-CM

## 2022-04-02 DIAGNOSIS — G9389 Other specified disorders of brain: Secondary | ICD-10-CM

## 2022-04-02 MED ORDER — PROPRANOLOL HCL 10 MG PO TABS: 10.0000 mg | ORAL_TABLET | Freq: Three times a day (TID) | ORAL | 1 refills | Status: DC

## 2022-04-02 NOTE — Progress Notes (Signed)
Cardiology Clinic Note   Patient Name: Sean Carrillo. Date of Encounter: 04/02/2022  Primary Care Provider:  Trey Sailors, PA Primary Cardiologist:  Stanford Breed   Patient Profile   56 year old male with history of palpitations and syncope.  He did have a cardiac monitor in 2021 that did show nonsustained ventricular tachycardia but no other significant arrhythmia.  CT scan of the head was negative for vascular abnormality.  There was a note about an irregular density in the parasellar region and prepontine cistern and MRI was recommended.  Echocardiogram in September 2022 showed normal LV function with mild left ventricular hypertrophy. Th patient was seen last by Dr. Stanford Breed on 02/24/2021.  Coreg is helping him to have relief from frequent palpitations although he does have some rarely.  Past Medical History    Past Medical History:  Diagnosis Date   Hyperlipidemia    Hypertension    Past Surgical History:  Procedure Laterality Date   Arthroscopic knee surgery     CHOLECYSTECTOMY      Allergies  No Known Allergies  History of Present Illness   Sean Carrillo comes today with his wife, complaining of significant changes in his vision especially when driving.  He states that things sometimes foggy over or get black and he is unable to see.  He states he has fully concentrate to see clearly while driving or to look away and look back while driving as his vision is worsening.  He sometimes feels as if he is passing out.  He also complains of palpitations and he had a little bit of discomfort in his chest without any sustaining symptoms of shortness of breath dizziness or nausea.  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  He is becoming concerned about his symptoms.  He is medically compliant.  He has a known history of brain mass which is being followed by Dr. Mickeal Skinner, and he is due to have a craniotomy on the right on 05/10/2022.  He  denies headaches, hearing loss, or seizures.  Home Medications    Current Outpatient Medications  Medication Sig Dispense Refill   ASA-APAP-Caff Buffered (VANQUISH) [phone] MG TABS Take 2-3 tablets by mouth daily as needed.     carvedilol (COREG) 6.25 MG tablet Take 1 tablet (6.25 mg total) by mouth 2 (two) times daily. 30 tablet 1   traZODone (DESYREL) 50 MG tablet Take 1 tablet (50 mg total) by mouth at bedtime as needed for sleep. 90 tablet 1   cyclobenzaprine (FLEXERIL) 10 MG tablet Take 1 tablet (10 mg total) by mouth 2 (two) times daily as needed for muscle spasms. (Patient not taking: Reported on 04/02/2022) 20 tablet 0   divalproex (DEPAKOTE) 500 MG DR tablet Take 1 tablet (500 mg total) by mouth 2 (two) times daily. (Patient not taking: Reported on 04/02/2022) 60 tablet 3   lidocaine (LIDODERM) 5 % Place 1 patch onto the skin daily. Remove & Discard patch within 12 hours or as directed by MD (Patient not taking: Reported on 04/02/2022) 30 patch 0   rosuvastatin (CRESTOR) 40 MG tablet Take 40 mg by mouth daily. (Patient not taking: Reported on 04/02/2022)     No current facility-administered medications for this visit.     Family History    Family History  Problem Relation Age of Onset   Hypertension Mother    Heart attack Father        Age 64  He indicated that his mother is alive. He indicated that his father is deceased. He indicated that his sister is alive.  Social History    Social History   Socioeconomic History   Marital status: Married    Spouse name: [name]   Number of children: 2   Years of education: Not on file   Highest education level: Some college, no degree  Occupational History   Not on file  Tobacco Use   Smoking status: Never   Smokeless tobacco: Never  Vaping Use   Vaping Use: Never used  Substance and Sexual Activity   Alcohol use: Never   Drug use: Never   Sexual activity: Not on file  Other Topics Concern   Not on file  Social History  Narrative   Lives with wife   Caffeine- rare   Social Determinants of Health   Financial Resource Strain: Not on file  Food Insecurity: Not on file  Transportation Needs: Not on file  Physical Activity: Not on file  Stress: Not on file  Social Connections: Not on file  Intimate Partner Violence: Not on file     Review of Systems    General:  No chills, fever, night sweats or weight changes.  Cardiovascular:  No chest pain, dyspnea on exertion, edema, orthopnea, positive for palpitations, paroxysmal nocturnal dyspnea. Dermatological: No rash, lesions/masses Respiratory: No cough, dyspnea Urologic: No hematuria, dysuria Abdominal:   No nausea, vomiting, diarrhea, bright red blood per rectum, melena, or hematemesis Neurologic: Positive for blurred vision, loss of vision, transient graying of his vision, with worsening changes. wkns, changes in mental status. All other systems reviewed and are otherwise negative except as noted above.     Physical Exam    VS:  BP (!) 138/98 (BP Location: Left Arm, Patient Position: Sitting, Cuff Size: Large)   Pulse 80   Ht '5\' 9"'$  (1.753 m)   Wt 229 lb 9.6 oz (104.1 kg)   SpO2 97%   BMI 33.91 kg/m  , BMI Body mass index is 33.91 kg/m. STOP-Bang Score:  7      GEN: Well nourished, well developed, in no acute distress. HEENT: normal. Neck: Supple, no JVD, carotid bruits, or masses. Cardiac: RRR, no murmurs, rubs, or gallops. No clubbing, cyanosis, edema.  Radials/DP/PT 2+ and equal bilaterally.  Respiratory:  Respirations regular and unlabored, clear to auscultation bilaterally. GI: Soft, nontender, nondistended, BS + x 4. MS: no deformity or atrophy. Skin: warm and dry, no rash. Neuro:  Strength and sensation are intact. Psych: Normal affect.  Accessory Clinical Findings    Lab Results  Component Value Date   WBC 5.1 10/24/2020   HGB 13.7 10/24/2020   HCT 41.7 10/24/2020   MCV 90.1 10/24/2020   PLT 222 10/24/2020   Lab Results   Component Value Date   CREATININE 1.23 10/24/2020   BUN 15 10/24/2020   NA 138 10/24/2020   K 4.2 10/24/2020   CL 105 10/24/2020   CO2 26 10/24/2020   No results found for: "ALT", "AST", "GGT", "ALKPHOS", "BILITOT" No results found for: "CHOL", "HDL", "LDLCALC", "LDLDIRECT", "TRIG", "CHOLHDL"  Lab Results  Component Value Date   HGBA1C 4.9 10/28/2020    Review of Prior Studies:  Echocardiogram 01/02/2021  1. Left ventricular ejection fraction, by estimation, is 60 to 65%. The  left ventricle has normal function. The left ventricle has no regional  wall motion abnormalities. There is mild concentric left ventricular  hypertrophy. Left ventricular diastolic  parameters were normal.  2. Right ventricular systolic function is normal. The right ventricular  size is normal. Tricuspid regurgitation signal is inadequate for assessing  PA pressure.   3. The mitral valve is normal in structure. Trivial mitral valve  regurgitation. No evidence of mitral stenosis.   4. The aortic valve is normal in structure. Aortic valve regurgitation is  not visualized. No aortic stenosis is present.   Assessment & Plan   1.  Frequent palpitations: He states that these palpitations sometimes coincide with his vision loss.  Usually it occurs after his vision changes.  He has been medically compliant with carvedilol.  But he is noticing some breakthrough rapid heart rhythm symptoms for several seconds after he experiences his vision changes.  I will provide for him propranolol 10 mg to use as needed for sustained rapid palpitations that are breaking through from ongoing carvedilol management.  I have checked orthostatics today to evaluate his heart rate and blood pressure with position changes.  They are negative.  2.  Possible OSA: Stop bang score was 7.  I would like to have him proceed with a home sleep study and possible CPAP titration.  He is willing to proceed with this.  3.  Brain mass: Seen on MRI:  Large enhancing dural-based mass centered at the central skull base, and smaller dural-based mass more inferiorly within the left aspect of the posterior fossa.  He is scheduled for craniotomy on 05/10/2022 with Dr. Zada Finders.  This apparently is affecting his vision and I have told him that he should not drive as he is having worsening incidences of blindness and vision changes while driving.  He verbalizes understanding but states that he is not going to stop driving as he needs to travel to do his job.  I advised that he bring someone with him so that he would be safe.  This is a medical necessity as losing his vision is becoming a more often occurrence which could lead to motor vehicle accident, and even death.    Current medicines are reviewed at length with the patient today.  I have spent 25 min's  dedicated to the care of this patient on the date of this encounter to include pre-visit review of records, assessment, management and diagnostic testing,with shared decision making. Signed, Phill Myron. West Pugh, ANP, AACC   04/02/2022 9:31 AM      Office [phone] Fax [phone]  Notice: This dictation was prepared with Dragon dictation along with smaller phrase technology. Any transcriptional errors that result from this process are unintentional and may not be corrected upon review.

## 2022-04-02 NOTE — Progress Notes (Signed)
  CHA2DS2-VASc Score =     This indicates a  % annual risk of stroke. The patient's score is based upon:

## 2022-04-02 NOTE — Patient Instructions (Signed)
Medication Instructions:  No Changes *If you need a refill on your cardiac medications before your next appointment, please call your pharmacy*   Lab Work: No Labs If you have labs (blood work) drawn today and your tests are completely normal, you will receive your results only by: [place] (if you have MyChart) OR A paper copy in the mail If you have any lab test that is abnormal or we need to change your treatment, we will call you to review the results.   Testing/Procedures: [place]. Your physician has recommended that you have a sleep study. This test records several body functions during sleep, including: brain activity, eye movement, oxygen and carbon dioxide blood levels, heart rate and rhythm, breathing rate and rhythm, the flow of air through your mouth and nose, snoring, body muscle movements, and chest and belly movement.    Follow-Up: At [facility], you and your health needs are our priority.  As part of our continuing mission to provide you with exceptional heart care, we have created designated Provider Care Teams.  These Care Teams include your primary Cardiologist (physician) and Advanced Practice Providers (APPs -  Physician Assistants and Nurse Practitioners) who all work together to provide you with the care you need, when you need it.  We recommend signing up for the patient portal called "MyChart".  Sign up information is provided on this After Visit Summary.  MyChart is used to connect with patients for Virtual Visits (Telemedicine).  Patients are able to view lab/test results, encounter notes, upcoming appointments, etc.  Non-urgent messages can be sent to your provider as well.   To learn more about what you can do with MyChart, go to [email or web address].    Your next appointment:   1 month(s)  The format for your next appointment:   In Person  Provider:   Jory Sims, FNP, DNP   Other Instructions No Driving

## 2022-04-06 ENCOUNTER — Telehealth: Payer: Self-pay | Admitting: *Deleted

## 2022-04-06 NOTE — Telephone Encounter (Signed)
Received a fax from [facility] denying requested HST. Ordering provider, Jory Sims will be notified for review and further recommendations. Instructions on how to do peer to peer given to her to refer to.

## 2022-04-12 DIAGNOSIS — J0100 Acute maxillary sinusitis, unspecified: Secondary | ICD-10-CM | POA: Diagnosis not present

## 2022-04-12 DIAGNOSIS — B353 Tinea pedis: Secondary | ICD-10-CM | POA: Diagnosis not present

## 2022-04-20 NOTE — Progress Notes (Signed)
Cardiology Clinic Note   Patient Name: Sean Carrillo. Date of Encounter: 04/20/2022  Primary Care Provider:  Trey Sailors, PA Primary Cardiologist:  Stanford Breed   Patient Profile    56 year old male with history of palpitations and syncope, hyperlipidemia, hypertension, and brain mass in the parasellar region and prepontine cistern per with history of nonsustained ventricular tachycardia for cardiac monitor in 2021 but no other significant arrhythmias.  Echocardiogram in September 2022 revealed normal LV systolic function with mild left ventricular hypertrophy.    He was also diagnosed with a history of a brain mass, described as enhancing dural based mass centered at the central skull base and smaller dural based mass more inferiorly with the left aspect of the posterior fossa.  He is followed by Dr. Mickeal Skinner and was due to have a craniotomy on the right on 05/10/2022.   He was last seen in the office on 04/02/2022, at which time he complained of vision disturbances feeling foggy, and eyes sight going black and unable to see.  He was advised on not being able to drive as his vision was worsening.  He was ordered a home sleep study but this was denied by insurance.  Past Medical History    Past Medical History:  Diagnosis Date   Hyperlipidemia    Hypertension    Past Surgical History:  Procedure Laterality Date   Arthroscopic knee surgery     CHOLECYSTECTOMY      Allergies  No Known Allergies  History of Present Illness    He comes today for follow up. He continues to have times when he riding in a car when his vision is blurred or he feels blackness coming to impede his vision. He has a brother in law who is doing his driving and also his manager who helps him get to other places when he is traveling. 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 He denies dizziness, near syncope, or DOE.    Home Medications    Current Outpatient Medications   Medication Sig Dispense Refill   ASA-APAP-Caff Buffered (VANQUISH) [phone] MG TABS Take 2-3 tablets by mouth daily as needed.     carvedilol (COREG) 6.25 MG tablet Take 1 tablet (6.25 mg total) by mouth 2 (two) times daily. 30 tablet 1   cyclobenzaprine (FLEXERIL) 10 MG tablet Take 1 tablet (10 mg total) by mouth 2 (two) times daily as needed for muscle spasms. (Patient not taking: Reported on 04/02/2022) 20 tablet 0   divalproex (DEPAKOTE) 500 MG DR tablet Take 1 tablet (500 mg total) by mouth 2 (two) times daily. (Patient not taking: Reported on 04/02/2022) 60 tablet 3   lidocaine (LIDODERM) 5 % Place 1 patch onto the skin daily. Remove & Discard patch within 12 hours or as directed by MD (Patient not taking: Reported on 04/02/2022) 30 patch 0   propranolol (INDERAL) 10 MG tablet Take 1 tablet (10 mg total) by mouth 3 (three) times daily. 30 tablet 1   rosuvastatin (CRESTOR) 40 MG tablet Take 40 mg by mouth daily. (Patient not taking: Reported on 04/02/2022)     traZODone (DESYREL) 50 MG tablet Take 1 tablet (50 mg total) by mouth at bedtime as needed for sleep. 90 tablet 1   No current facility-administered medications for this visit.     Family History    Family History  Problem Relation Age of Onset   Hypertension Mother    Heart attack Father  Age 69   He indicated that his mother is alive. He indicated that his father is deceased. He indicated that his sister is alive.  Social History    Social History   Socioeconomic History   Marital status: Married    Spouse name: [name]   Number of children: 2   Years of education: Not on file   Highest education level: Some college, no degree  Occupational History   Not on file  Tobacco Use   Smoking status: Never   Smokeless tobacco: Never  Vaping Use   Vaping Use: Never used  Substance and Sexual Activity   Alcohol use: Never   Drug use: Never   Sexual activity: Not on file  Other Topics Concern   Not on file  Social  History Narrative   Lives with wife   Caffeine- rare   Social Determinants of Health   Financial Resource Strain: Not on file  Food Insecurity: Not on file  Transportation Needs: Not on file  Physical Activity: Not on file  Stress: Not on file  Social Connections: Not on file  Intimate Partner Violence: Not on file     Review of Systems    General:  No chills, fever, night sweats or weight changes.  Cardiovascular:  No chest pain, dyspnea on exertion, edema, orthopnea, palpitations, paroxysmal nocturnal dyspnea. Dermatological: No rash, lesions/masses Respiratory: No cough, dyspnea Urologic: No hematuria, dysuria Abdominal:   No nausea, vomiting, diarrhea, bright red blood per rectum, melena, or hematemesis Neurologic:  No visual changes, wkns, changes in mental status. All other systems reviewed and are otherwise negative except as noted above.     Physical Exam    VS:  There were no vitals taken for this visit. , BMI There is no height or weight on file to calculate BMI. STOP-Bang Score:  7      GEN: Well nourished, well developed, in no acute distress. HEENT: normal. Neck: Supple, no JVD, carotid bruits, or masses. Cardiac: RRR, no murmurs, rubs, or gallops. No clubbing, cyanosis, edema.  Radials/DP/PT 2+ and equal bilaterally.  Respiratory:  Respirations regular and unlabored, clear to auscultation bilaterally. GI: Soft, nontender, nondistended, BS + x 4. MS: no deformity or atrophy. Skin: warm and dry, no rash. Neuro:  Strength and sensation are intact. Psych: Normal affect.  Accessory Clinical Findings    ECG personally reviewed by me today- Not completed - No acute changes  Lab Results  Component Value Date   WBC 5.1 10/24/2020   HGB 13.7 10/24/2020   HCT 41.7 10/24/2020   MCV 90.1 10/24/2020   PLT 222 10/24/2020   Lab Results  Component Value Date   CREATININE 1.23 10/24/2020   BUN 15 10/24/2020   NA 138 10/24/2020   K 4.2 10/24/2020   CL 105  10/24/2020   CO2 26 10/24/2020   No results found for: "ALT", "AST", "GGT", "ALKPHOS", "BILITOT" No results found for: "CHOL", "HDL", "LDLCALC", "LDLDIRECT", "TRIG", "CHOLHDL"  Lab Results  Component Value Date   HGBA1C 4.9 10/28/2020    Review of Prior Studies:  Echocardiogram 01/01/2021 1. Left ventricular ejection fraction, by estimation, is 60 to 65%. The  left ventricle has normal function. The left ventricle has no regional  wall motion abnormalities. There is mild concentric left ventricular  hypertrophy. Left ventricular diastolic  parameters were normal.   2. Right ventricular systolic function is normal. The right ventricular  size is normal. Tricuspid regurgitation signal is inadequate for assessing  PA pressure.  3. The mitral valve is normal in structure. Trivial mitral valve  regurgitation. No evidence of mitral stenosis.   4. The aortic valve is normal in structure. Aortic valve regurgitation is  not visualized. No aortic stenosis is present.   MRI Brain w/wo contrast 12/06/2021 Unchanged large enhancing dural-based mass centered at the central skull base, and smaller dural-based mass more inferiorly within the left aspect of the posterior fossa. These masses may reflect meningiomas. However, alternative etiologies (such as sarcoidosis, IgG4 related disease, or lymphoma) cannot be excluded.   Redemonstrated chronic left basal ganglia lacunar infarct.   Small left maxillary sinus mucous retention cyst.  Assessment & Plan   1.  Vision changes: I will not order a sleep study or cardiac monitor at this time as he is undergoing surgery to remove brain mass on 18/2023. Once this is removed and he is recovered I will revisit this and assess his symptoms.   2. Hypertension: elevated today. Will continue his medication regimen for now as his BP has been better controlled on other visits. Will revisit home sleep study and will do an appeal for this study with the insurance  company.   3. Brain mass: To have this surgically removed on 05/10/2021 by neurosurgeon. He will follow up with [place] in 3 months post recovery.     Current medicines are reviewed at length with the patient today.  I have spent 5 min's  dedicated to the care of this patient on the date of this encounter to include pre-visit review of records, assessment, management and diagnostic testing,with shared decision making. Signed, Phill Myron. West Pugh, ANP, AACC   04/20/2022 11:17 AM      Office [phone] Fax [phone]  Notice: This dictation was prepared with Dragon dictation along with smaller phrase technology. Any transcriptional errors that result from this process are unintentional and may not be corrected upon review.

## 2022-04-23 ENCOUNTER — Ambulatory Visit: Payer: BC Managed Care – PPO | Attending: Adult Health | Admitting: Adult Health

## 2022-04-23 ENCOUNTER — Encounter: Payer: Self-pay | Admitting: Adult Health

## 2022-04-23 VITALS — BP 156/100 | HR 83 | Ht 69.0 in | Wt 230.0 lb

## 2022-04-23 DIAGNOSIS — G9389 Other specified disorders of brain: Secondary | ICD-10-CM

## 2022-04-23 DIAGNOSIS — I10 Essential (primary) hypertension: Secondary | ICD-10-CM

## 2022-04-23 DIAGNOSIS — R002 Palpitations: Secondary | ICD-10-CM

## 2022-04-23 MED ORDER — CARVEDILOL 6.25 MG PO TABS: 6.2500 mg | ORAL_TABLET | Freq: Two times a day (BID) | ORAL | 3 refills | Status: DC

## 2022-04-23 NOTE — Patient Instructions (Signed)
Medication Instructions:  No Changes *If you need a refill on your cardiac medications before your next appointment, please call your pharmacy*   Lab Work: No labs If you have labs (blood work) drawn today and your tests are completely normal, you will receive your results only by: [place] (if you have MyChart) OR A paper copy in the mail If you have any lab test that is abnormal or we need to change your treatment, we will call you to review the results.   Testing/Procedures: No Testing   Follow-Up: At [facility], you and your health needs are our priority.  As part of our continuing mission to provide you with exceptional heart care, we have created designated Provider Care Teams.  These Care Teams include your primary Cardiologist (physician) and Advanced Practice Providers (APPs -  Physician Assistants and Nurse Practitioners) who all work together to provide you with the care you need, when you need it.  We recommend signing up for the patient portal called "MyChart".  Sign up information is provided on this After Visit Summary.  MyChart is used to connect with patients for Virtual Visits (Telemedicine).  Patients are able to view lab/test results, encounter notes, upcoming appointments, etc.  Non-urgent messages can be sent to your provider as well.   To learn more about what you can do with MyChart, go to [email or web address].    Your next appointment:   3 month(s)  The format for your next appointment:   In Person  Provider:   Jory Sims, NP

## 2022-04-27 NOTE — Pre-Procedure Instructions (Signed)
Surgical Instructions    Your procedure is scheduled on Friday, January 5th.  Report to [facility] Main Entrance "A" at 5:30 A.M., then check in with the Admitting office.  Call this number if you have problems the morning of surgery:  [phone]  If you have any questions prior to your surgery date call [phone]: Open Monday-Friday 8am-4pm If you experience any cold or flu symptoms such as cough, fever, chills, shortness of breath, etc. between now and your scheduled surgery, please notify us at the above number.     Remember:  Do not eat after midnight the night before your surgery  You may drink clear liquids until 4:30 a.m. the morning of your surgery.   Clear liquids allowed are: Water, Non-Citrus Juices (without pulp), Carbonated Beverages, Clear Tea, Black Coffee Only (NO MILK, CREAM OR POWDERED CREAMER of any kind), and Gatorade.    Take these medicines the morning of surgery with A SIP OF WATER  carvedilol (COREG)  propranolol (INDERAL) -if needed for A.Fib  As of today, STOP taking any Aspirin (unless otherwise instructed by your surgeon) Aleve, Naproxen, Ibuprofen, Motrin, Advil, Goody's, BC's, all herbal medications, fish oil, and all vitamins.                     Do NOT Smoke (Tobacco/Vaping) for 24 hours prior to your procedure.  If you use a CPAP at night, you may bring your mask/headgear for your overnight stay.   Contacts, glasses, piercing's, hearing aid's, dentures or partials may not be worn into surgery, please bring cases for these belongings.    For patients admitted to the hospital, discharge time will be determined by your treatment team.   Patients discharged the day of surgery will not be allowed to drive home, and someone needs to stay with them for 24 hours.  SURGICAL WAITING ROOM VISITATION Patients having surgery or a procedure may have no more than 2 support people in the waiting area - these visitors may rotate.   Children under the age of  22 must have an adult with them who is not the patient. If the patient needs to stay at the hospital during part of their recovery, the visitor guidelines for inpatient rooms apply. Pre-op nurse will coordinate an appropriate time for 1 support person to accompany patient in pre-op.  This support person may not rotate.   Please refer to the [facility] website for the visitor guidelines for Inpatients (after your surgery is over and you are in a regular room).    Special instructions:   [place]- Preparing For Surgery  Before surgery, you can play an important role. Because skin is not sterile, your skin needs to be as free of germs as possible. You can reduce the number of germs on your skin by washing with CHG (chlorahexidine gluconate) Soap before surgery.  CHG is an antiseptic cleaner which kills germs and bonds with the skin to continue killing germs even after washing.    Oral Hygiene is also important to reduce your risk of infection.  Remember - BRUSH YOUR TEETH THE MORNING OF SURGERY WITH YOUR REGULAR TOOTHPASTE  Please do not use if you have an allergy to CHG or antibacterial soaps. If your skin becomes reddened/irritated stop using the CHG.  Do not shave (including legs and underarms) for at least 48 hours prior to first CHG shower. It is OK to shave your face.  Please follow these instructions carefully.   Shower the [employer]  BEFORE SURGERY and the MORNING OF SURGERY  If you chose to wash your hair, wash your hair first as usual with your normal shampoo.  After you shampoo, rinse your hair and body thoroughly to remove the shampoo.  Use CHG Soap as you would any other liquid soap. You can apply CHG directly to the skin and wash gently with a scrungie or a clean washcloth.   Apply the CHG Soap to your body ONLY FROM THE NECK DOWN.  Do not use on open wounds or open sores. Avoid contact with your eyes, ears, mouth and genitals (private parts). Wash Face and genitals (private  parts)  with your normal soap.   Wash thoroughly, paying special attention to the area where your surgery will be performed.  Thoroughly rinse your body with warm water from the neck down.  DO NOT shower/wash with your normal soap after using and rinsing off the CHG Soap.  Pat yourself dry with a CLEAN TOWEL.  Wear CLEAN PAJAMAS to bed the night before surgery  Place CLEAN SHEETS on your bed the night before your surgery  DO NOT SLEEP WITH PETS.   Day of Surgery: Take a shower with CHG soap. Do not wear jewelry  Do not wear lotions, powders, colognes, or deodorant. Do not shave 48 hours prior to surgery.  Men may shave face and neck. Do not bring valuables to the hospital.  [facility] is not responsible for any belongings or valuables. Wear Clean/Comfortable clothing the morning of surgery Remember to brush your teeth WITH YOUR REGULAR TOOTHPASTE.   Please read over the following fact sheets that you were given.    If you received a COVID test during your pre-op visit  it is requested that you wear a mask when out in public, stay away from anyone that may not be feeling well and notify your surgeon if you develop symptoms. If you have been in contact with anyone that has tested positive in the last 10 days please notify you surgeon.

## 2022-04-28 ENCOUNTER — Encounter (HOSPITAL_COMMUNITY)
Admission: RE | Admit: 2022-04-28 | Discharge: 2022-04-28 | Disposition: A | Discharge status: Home / Self Care | Payer: BC Managed Care – PPO | Source: Ambulatory Visit | Attending: Neurological Surgery | Admitting: Neurological Surgery

## 2022-04-28 ENCOUNTER — Other Ambulatory Visit: Payer: Self-pay

## 2022-04-28 ENCOUNTER — Encounter (HOSPITAL_COMMUNITY): Payer: Self-pay

## 2022-04-28 VITALS — BP 164/108 | HR 73 | Temp 98.0°F | Resp 18 | Ht 69.0 in | Wt 229.0 lb

## 2022-04-28 DIAGNOSIS — E785 Hyperlipidemia, unspecified: Secondary | ICD-10-CM | POA: Diagnosis not present

## 2022-04-28 DIAGNOSIS — D496 Neoplasm of unspecified behavior of brain: Secondary | ICD-10-CM | POA: Diagnosis not present

## 2022-04-28 DIAGNOSIS — Z01818 Encounter for other preprocedural examination: Secondary | ICD-10-CM | POA: Diagnosis not present

## 2022-04-28 DIAGNOSIS — I10 Essential (primary) hypertension: Secondary | ICD-10-CM | POA: Diagnosis not present

## 2022-04-28 DIAGNOSIS — Z9049 Acquired absence of other specified parts of digestive tract: Secondary | ICD-10-CM | POA: Insufficient documentation

## 2022-04-28 HISTORY — DX: Paralytic syndrome, unspecified: G83.9

## 2022-04-28 HISTORY — DX: Neoplasm of unspecified behavior of brain: D49.6

## 2022-04-28 LAB — BASIC METABOLIC PANEL
Anion gap: 5 (ref 5–15)
BUN: 13 mg/dL (ref 6–20)
CO2: 26 mmol/L (ref 22–32)
Calcium: 8.8 mg/dL — ABNORMAL LOW (ref 8.9–10.3)
Chloride: 108 mmol/L (ref 98–111)
Creatinine, Ser: 1.17 mg/dL (ref 0.61–1.24)
GFR, Estimated: >60 mL/min (ref >60)
Glucose, Bld: 88 mg/dL (ref 70–99)
Potassium: 4.6 mmol/L (ref 3.5–5.1)
Sodium: 139 mmol/L (ref 135–145)

## 2022-04-28 LAB — CBC
HCT: 47.4 % (ref 39.0–52.0)
Hemoglobin: 15 g/dL (ref 13.0–17.0)
MCH: 29.2 pg (ref 26.0–34.0)
MCHC: 31.6 g/dL (ref 30.0–36.0)
MCV: 92.2 fL (ref 80.0–100.0)
Platelets: 285 10*3/uL (ref 150–400)
RBC: 5.14 MIL/uL (ref 4.22–5.81)
RDW: 12.5 % (ref 11.5–15.5)
WBC: 4.5 10*3/uL (ref 4.0–10.5)
nRBC: 0 % (ref 0.0–0.2)

## 2022-04-28 LAB — TYPE AND SCREEN
ABO/RH(D): O POS
Antibody Screen: NEGATIVE

## 2022-04-28 NOTE — Progress Notes (Signed)
Anesthesia Chart Review:  Case: [id number] Date/Time: 05/10/22 0715   Procedures:      Right craniotomy for tumor resection (Right) - RM 21     APPLICATION OF CRANIAL NAVIGATION   Anesthesia type: General   Pre-op diagnosis: Brain tumor   Location: [facility] OR ROOM 21 / [facility]   Surgeons: Judith Part, MD       DISCUSSION:  He had not taken Coreg today. RN staff confirmed he does have Coreg to take at home and asked him to be compliant with BID regimen. He was also encouraged to buy a home BP monitor.   VS: BP (!) 164/108   Pulse 73   Temp 36.7 C   Resp 18   Ht '5\' 9"'$  (1.753 m)   Wt 103.9 kg   SpO2 98%   BMI 33.82 kg/m   PROVIDERS: Trey Sailors, PA   LABS: {CHL AN LABS REVIEWED:112001::"Labs reviewed: Acceptable for surgery."} (all labs ordered are listed, but only abnormal results are displayed)  Labs Reviewed  CBC  BASIC METABOLIC PANEL  TYPE AND SCREEN     IMAGES:   EKG:   CV:  Past Medical History:  Diagnosis Date   Brain tumor ([place])    Hyperlipidemia    Hypertension    Paralysis ([place])     Past Surgical History:  Procedure Laterality Date   Arthroscopic knee surgery     CHOLECYSTECTOMY      MEDICATIONS:  ASA-APAP-Caff Buffered (VANQUISH PO)   carvedilol (COREG) 6.25 MG tablet   cyclobenzaprine (FLEXERIL) 10 MG tablet   divalproex (DEPAKOTE) 500 MG DR tablet   lidocaine (LIDODERM) 5 %   propranolol (INDERAL) 10 MG tablet   traZODone (DESYREL) 50 MG tablet   No current facility-administered medications for this encounter.

## 2022-04-28 NOTE — Progress Notes (Signed)
Pt's bp elevated 164/108.  Per pt did not take his bp medication today.  Pt was advised to take coreg bid as prescribed and to monitor bp at home. Ebony Hail was notified.  Chart will be send for anesthesia review due to cardiac hx.

## 2022-04-28 NOTE — Progress Notes (Addendum)
PCP - Raelyn Number Cardiologist - Dr Lura Em  PPM/ICD - Denies  Chest x-ray - Palpitations at baseline. Denies SOB EKG - 04/28/2022 Stress Test - Denies ECHO - 01/02/2021 Cardiac Cath - denies  Sleep Study - Yes. Inconclusive  DM: denies Aspirin Instructions: Per pt's wife instructed to stop several months ago.   ERAS Protcol - Yes   COVID TEST- N/A   Anesthesia review: Yes, sees cardiology  Patient denies shortness of breath, fever, cough and chest pain at PAT appointment   All instructions explained to the patient, with a verbal understanding of the material. Patient agrees to go over the instructions while at home for a better understanding.  The opportunity to ask questions was provided.   Updated pt instructions for arrival on Monday 05/10/2021 at 0530

## 2022-04-29 NOTE — Anesthesia Preprocedure Evaluation (Addendum)
Anesthesia Evaluation  Patient identified by MRN, date of birth, ID band Patient awake    Reviewed: Allergy & Precautions, H&P , NPO status , Patient's Chart, lab work & pertinent test results  Airway Mallampati: II   Neck ROM: full    Dental   Pulmonary    breath sounds clear to auscultation       Cardiovascular hypertension,  Rhythm:regular Rate:Normal     Neuro/Psych Brain mass    GI/Hepatic   Endo/Other    Renal/GU      Musculoskeletal   Abdominal   Peds  Hematology   Anesthesia Other Findings   Reproductive/Obstetrics                             Anesthesia Physical Anesthesia Plan  ASA: 2  Anesthesia Plan: General   Post-op Pain Management:    Induction: Intravenous  PONV Risk Score and Plan: 2 and Ondansetron, Dexamethasone, Midazolam and Treatment may vary due to age or medical condition  Airway Management Planned: Oral ETT  Additional Equipment: Arterial line  Intra-op Plan:   Post-operative Plan: Extubation in OR  Informed Consent: I have reviewed the patients History and Physical, chart, labs and discussed the procedure including the risks, benefits and alternatives for the proposed anesthesia with the patient or authorized representative who has indicated his/her understanding and acceptance.     Dental advisory given  Plan Discussed with: CRNA, Anesthesiologist and Surgeon  Anesthesia Plan Comments: (PAT note written 04/29/2022 by Myra Gianotti, PA-C.  )       Anesthesia Quick Evaluation

## 2022-05-07 ENCOUNTER — Encounter (HOSPITAL_COMMUNITY): Payer: Self-pay | Admitting: Neurological Surgery

## 2022-05-10 ENCOUNTER — Other Ambulatory Visit: Payer: Self-pay

## 2022-05-10 ENCOUNTER — Inpatient Hospital Stay (HOSPITAL_COMMUNITY)
Admission: RE | Admit: 2022-05-10 | Discharge: 2022-05-13 | DRG: 027 | Disposition: A | Discharge status: Home / Self Care | Payer: BC Managed Care – PPO | Attending: Neurological Surgery | Admitting: Neurological Surgery

## 2022-05-10 ENCOUNTER — Inpatient Hospital Stay (HOSPITAL_COMMUNITY): Payer: BC Managed Care – PPO | Admitting: Vascular Surgery

## 2022-05-10 ENCOUNTER — Encounter (HOSPITAL_COMMUNITY)
Admission: RE | Disposition: A | Discharge status: Home / Self Care | Payer: Self-pay | Source: Home / Self Care | Attending: Neurological Surgery

## 2022-05-10 ENCOUNTER — Encounter (HOSPITAL_COMMUNITY): Payer: Self-pay | Admitting: Neurological Surgery

## 2022-05-10 ENCOUNTER — Inpatient Hospital Stay (HOSPITAL_COMMUNITY): Payer: BC Managed Care – PPO | Admitting: Certified Registered Nurse Anesthetist

## 2022-05-10 DIAGNOSIS — G9389 Other specified disorders of brain: Principal | ICD-10-CM

## 2022-05-10 DIAGNOSIS — D496 Neoplasm of unspecified behavior of brain: Principal | ICD-10-CM | POA: Diagnosis present

## 2022-05-10 DIAGNOSIS — E785 Hyperlipidemia, unspecified: Secondary | ICD-10-CM | POA: Diagnosis not present

## 2022-05-10 DIAGNOSIS — Z8673 Personal history of transient ischemic attack (TIA), and cerebral infarction without residual deficits: Secondary | ICD-10-CM | POA: Diagnosis not present

## 2022-05-10 DIAGNOSIS — I10 Essential (primary) hypertension: Secondary | ICD-10-CM | POA: Diagnosis not present

## 2022-05-10 DIAGNOSIS — R220 Localized swelling, mass and lump, head: Secondary | ICD-10-CM | POA: Diagnosis not present

## 2022-05-10 DIAGNOSIS — Z8249 Family history of ischemic heart disease and other diseases of the circulatory system: Secondary | ICD-10-CM | POA: Diagnosis not present

## 2022-05-10 DIAGNOSIS — R55 Syncope and collapse: Secondary | ICD-10-CM | POA: Diagnosis not present

## 2022-05-10 DIAGNOSIS — I6200 Nontraumatic subdural hemorrhage, unspecified: Secondary | ICD-10-CM | POA: Diagnosis not present

## 2022-05-10 DIAGNOSIS — D4989 Neoplasm of unspecified behavior of other specified sites: Secondary | ICD-10-CM | POA: Diagnosis not present

## 2022-05-10 DIAGNOSIS — Z9889 Other specified postprocedural states: Secondary | ICD-10-CM | POA: Diagnosis not present

## 2022-05-10 DIAGNOSIS — R001 Bradycardia, unspecified: Secondary | ICD-10-CM | POA: Diagnosis not present

## 2022-05-10 DIAGNOSIS — R002 Palpitations: Secondary | ICD-10-CM | POA: Diagnosis present

## 2022-05-10 DIAGNOSIS — D492 Neoplasm of unspecified behavior of bone, soft tissue, and skin: Secondary | ICD-10-CM | POA: Diagnosis not present

## 2022-05-10 DIAGNOSIS — G0490 Encephalitis and encephalomyelitis, unspecified: Secondary | ICD-10-CM | POA: Diagnosis not present

## 2022-05-10 DIAGNOSIS — G939 Disorder of brain, unspecified: Secondary | ICD-10-CM | POA: Diagnosis present

## 2022-05-10 HISTORY — PX: CRANIOTOMY: SHX93

## 2022-05-10 HISTORY — PX: APPLICATION OF CRANIAL NAVIGATION: SHX6578

## 2022-05-10 LAB — ABO/RH: ABO/RH(D): O POS

## 2022-05-10 LAB — POCT I-STAT 7, (LYTES, BLD GAS, ICA,H+H)
Acid-base deficit: 6 mmol/L — ABNORMAL HIGH (ref 0.0–2.0)
Bicarbonate: 19.8 mmol/L — ABNORMAL LOW (ref 20.0–28.0)
Calcium, Ion: 1.13 mmol/L — ABNORMAL LOW (ref 1.15–1.40)
HCT: 38 % — ABNORMAL LOW (ref 39.0–52.0)
Hemoglobin: 12.9 g/dL — ABNORMAL LOW (ref 13.0–17.0)
O2 Saturation: 100 %
Potassium: 3.6 mmol/L (ref 3.5–5.1)
Sodium: 144 mmol/L (ref 135–145)
TCO2: 21 mmol/L — ABNORMAL LOW (ref 22–32)
pCO2 arterial: 38 mmHg (ref 32–48)
pH, Arterial: 7.324 — ABNORMAL LOW (ref 7.35–7.45)
pO2, Arterial: 197 mmHg — ABNORMAL HIGH (ref 83–108)

## 2022-05-10 LAB — CBC
HCT: 41.6 % (ref 39.0–52.0)
Hemoglobin: 13.5 g/dL (ref 13.0–17.0)
MCH: 29.3 pg (ref 26.0–34.0)
MCHC: 32.5 g/dL (ref 30.0–36.0)
MCV: 90.4 fL (ref 80.0–100.0)
Platelets: 331 10*3/uL (ref 150–400)
RBC: 4.6 MIL/uL (ref 4.22–5.81)
RDW: 12.5 % (ref 11.5–15.5)
WBC: 9.3 10*3/uL (ref 4.0–10.5)
nRBC: 0 % (ref 0.0–0.2)

## 2022-05-10 LAB — CREATININE, SERUM
Creatinine, Ser: 0.99 mg/dL (ref 0.61–1.24)
GFR, Estimated: >60 mL/min (ref >60)

## 2022-05-10 SURGERY — CRANIOTOMY TUMOR EXCISION
Anesthesia: General | Laterality: Right

## 2022-05-10 MED ORDER — POLYETHYLENE GLYCOL 3350 17 G PO PACK: 17.0000 g | PACK | Freq: Every day | ORAL | Status: DC | PRN

## 2022-05-10 MED ORDER — ORAL CARE MOUTH RINSE: 15.0000 mL | Freq: Once | OROMUCOSAL | Status: AC

## 2022-05-10 MED ORDER — ROCURONIUM BROMIDE 10 MG/ML (PF) SYRINGE
PREFILLED_SYRINGE | INTRAVENOUS | Status: AC
  Filled 2022-05-10: qty 10

## 2022-05-10 MED ORDER — PROPOFOL 10 MG/ML IV BOLUS
INTRAVENOUS | Status: DC | PRN
  Administered 2022-05-10: 200 mg via INTRAVENOUS
  Administered 2022-05-10: 60 mg via INTRAVENOUS

## 2022-05-10 MED ORDER — PROMETHAZINE HCL 12.5 MG PO TABS
12.5000 mg | ORAL_TABLET | ORAL | Status: DC | PRN
  Administered 2022-05-10 – 2022-05-11 (×2): 25 mg via ORAL
  Administered 2022-05-11 (×2): 12.5 mg via ORAL
  Administered 2022-05-12 (×2): 25 mg via ORAL
  Filled 2022-05-10 (×8): qty 2

## 2022-05-10 MED ORDER — PROPOFOL 10 MG/ML IV BOLUS
INTRAVENOUS | Status: AC
  Filled 2022-05-10: qty 20

## 2022-05-10 MED ORDER — ONDANSETRON HCL 4 MG PO TABS: 4.0000 mg | ORAL_TABLET | ORAL | Status: DC | PRN

## 2022-05-10 MED ORDER — ROCURONIUM BROMIDE 10 MG/ML (PF) SYRINGE
PREFILLED_SYRINGE | INTRAVENOUS | Status: DC | PRN
  Administered 2022-05-10: 30 mg via INTRAVENOUS
  Administered 2022-05-10: 70 mg via INTRAVENOUS
  Administered 2022-05-10: 20 mg via INTRAVENOUS

## 2022-05-10 MED ORDER — HYDROCODONE-ACETAMINOPHEN 5-325 MG PO TABS
1.0000 | ORAL_TABLET | ORAL | Status: DC | PRN
  Administered 2022-05-10 (×2): 1 via ORAL
  Filled 2022-05-10 (×2): qty 1

## 2022-05-10 MED ORDER — LIDOCAINE-EPINEPHRINE 1 %-1:100000 IJ SOLN
INTRAMUSCULAR | Status: DC | PRN
  Administered 2022-05-10: 10 mL

## 2022-05-10 MED ORDER — OXYCODONE HCL 5 MG/5ML PO SOLN: 5.0000 mg | Freq: Once | ORAL | Status: DC | PRN

## 2022-05-10 MED ORDER — THROMBIN 20000 UNITS EX SOLR
CUTANEOUS | Status: AC
  Filled 2022-05-10: qty 20000

## 2022-05-10 MED ORDER — ONDANSETRON HCL 4 MG/2ML IJ SOLN
INTRAMUSCULAR | Status: DC | PRN
  Administered 2022-05-10: 4 mg via INTRAVENOUS

## 2022-05-10 MED ORDER — LIDOCAINE 2% (20 MG/ML) 5 ML SYRINGE
INTRAMUSCULAR | Status: DC | PRN
  Administered 2022-05-10: 80 mg via INTRAVENOUS

## 2022-05-10 MED ORDER — ACETAMINOPHEN 325 MG PO TABS
650.0000 mg | ORAL_TABLET | ORAL | Status: DC | PRN
  Administered 2022-05-10 – 2022-05-12 (×6): 650 mg via ORAL
  Filled 2022-05-10 (×6): qty 2

## 2022-05-10 MED ORDER — CEFAZOLIN SODIUM-DEXTROSE 2-4 GM/100ML-% IV SOLN
2.0000 g | INTRAVENOUS | Status: AC
  Administered 2022-05-10: 2 g via INTRAVENOUS
  Filled 2022-05-10: qty 100

## 2022-05-10 MED ORDER — PHENYLEPHRINE 80 MCG/ML (10ML) SYRINGE FOR IV PUSH (FOR BLOOD PRESSURE SUPPORT): PREFILLED_SYRINGE | INTRAVENOUS | Status: DC | PRN

## 2022-05-10 MED ORDER — CHLORHEXIDINE GLUCONATE CLOTH 2 % EX PADS: 6.0000 | MEDICATED_PAD | Freq: Once | CUTANEOUS | Status: DC

## 2022-05-10 MED ORDER — THROMBIN 5000 UNITS EX SOLR
CUTANEOUS | Status: AC
  Filled 2022-05-10: qty 5000

## 2022-05-10 MED ORDER — SODIUM CHLORIDE 0.9 % IV SOLN
0.1500 ug/kg/min | INTRAVENOUS | Status: AC
  Administered 2022-05-10: .2 ug/kg/min via INTRAVENOUS
  Filled 2022-05-10: qty 5000

## 2022-05-10 MED ORDER — CHLORHEXIDINE GLUCONATE 0.12 % MT SOLN
15.0000 mL | Freq: Once | OROMUCOSAL | Status: AC
  Administered 2022-05-10: 15 mL via OROMUCOSAL
  Filled 2022-05-10: qty 15

## 2022-05-10 MED ORDER — THROMBIN 5000 UNITS EX SOLR
OROMUCOSAL | Status: DC | PRN
  Administered 2022-05-10: 5 mL via TOPICAL

## 2022-05-10 MED ORDER — ONDANSETRON HCL 4 MG/2ML IJ SOLN: 4.0000 mg | Freq: Four times a day (QID) | INTRAMUSCULAR | Status: DC | PRN

## 2022-05-10 MED ORDER — ACETAMINOPHEN 650 MG RE SUPP: 650.0000 mg | RECTAL | Status: DC | PRN

## 2022-05-10 MED ORDER — CEFAZOLIN SODIUM-DEXTROSE 2-4 GM/100ML-% IV SOLN
2.0000 g | Freq: Three times a day (TID) | INTRAVENOUS | Status: AC
  Administered 2022-05-10 (×2): 2 g via INTRAVENOUS
  Filled 2022-05-10 (×2): qty 100

## 2022-05-10 MED ORDER — FENTANYL CITRATE (PF) 250 MCG/5ML IJ SOLN
INTRAMUSCULAR | Status: DC | PRN
  Administered 2022-05-10: 150 ug via INTRAVENOUS

## 2022-05-10 MED ORDER — SUGAMMADEX SODIUM 200 MG/2ML IV SOLN
INTRAVENOUS | Status: DC | PRN
  Administered 2022-05-10: 200 mg via INTRAVENOUS

## 2022-05-10 MED ORDER — MIDAZOLAM HCL 2 MG/2ML IJ SOLN
INTRAMUSCULAR | Status: DC | PRN
  Administered 2022-05-10 (×2): 1 mg via INTRAVENOUS

## 2022-05-10 MED ORDER — SODIUM CHLORIDE 0.9 % IV SOLN: INTRAVENOUS | Status: DC | PRN

## 2022-05-10 MED ORDER — LIDOCAINE 2% (20 MG/ML) 5 ML SYRINGE
INTRAMUSCULAR | Status: AC
  Filled 2022-05-10: qty 5

## 2022-05-10 MED ORDER — FENTANYL CITRATE (PF) 100 MCG/2ML IJ SOLN
25.0000 ug | INTRAMUSCULAR | Status: DC | PRN
  Administered 2022-05-10: 50 ug via INTRAVENOUS

## 2022-05-10 MED ORDER — LACTATED RINGERS IV SOLN: INTRAVENOUS | Status: DC

## 2022-05-10 MED ORDER — 0.9 % SODIUM CHLORIDE (POUR BTL) OPTIME
TOPICAL | Status: DC | PRN
  Administered 2022-05-10: 2000 mL

## 2022-05-10 MED ORDER — ONDANSETRON HCL 4 MG/2ML IJ SOLN
4.0000 mg | INTRAMUSCULAR | Status: DC | PRN
  Administered 2022-05-10 – 2022-05-12 (×2): 4 mg via INTRAVENOUS
  Filled 2022-05-10 (×2): qty 2

## 2022-05-10 MED ORDER — HEPARIN SODIUM (PORCINE) 5000 UNIT/ML IJ SOLN
5000.0000 [IU] | Freq: Three times a day (TID) | INTRAMUSCULAR | Status: DC
  Administered 2022-05-12 – 2022-05-13 (×5): 5000 [IU] via SUBCUTANEOUS
  Filled 2022-05-10 (×4): qty 1

## 2022-05-10 MED ORDER — HYDRALAZINE HCL 20 MG/ML IJ SOLN
INTRAMUSCULAR | Status: AC
  Filled 2022-05-10: qty 1

## 2022-05-10 MED ORDER — DEXAMETHASONE SODIUM PHOSPHATE 10 MG/ML IJ SOLN
INTRAMUSCULAR | Status: DC | PRN
  Administered 2022-05-10: 10 mg via INTRAVENOUS

## 2022-05-10 MED ORDER — LABETALOL HCL 5 MG/ML IV SOLN
10.0000 mg | INTRAVENOUS | Status: DC | PRN
  Administered 2022-05-10: 40 mg via INTRAVENOUS
  Administered 2022-05-10: 20 mg via INTRAVENOUS
  Filled 2022-05-10: qty 8

## 2022-05-10 MED ORDER — OXYCODONE HCL 5 MG PO TABS: 5.0000 mg | ORAL_TABLET | Freq: Once | ORAL | Status: DC | PRN

## 2022-05-10 MED ORDER — FENTANYL CITRATE (PF) 250 MCG/5ML IJ SOLN
INTRAMUSCULAR | Status: AC
  Filled 2022-05-10: qty 5

## 2022-05-10 MED ORDER — THROMBIN 20000 UNITS EX SOLR
CUTANEOUS | Status: DC | PRN
  Administered 2022-05-10: 20 mL via TOPICAL

## 2022-05-10 MED ORDER — CARVEDILOL 6.25 MG PO TABS
6.2500 mg | ORAL_TABLET | Freq: Two times a day (BID) | ORAL | Status: DC
  Administered 2022-05-10 – 2022-05-12 (×2): 6.25 mg via ORAL
  Filled 2022-05-10 (×7): qty 1

## 2022-05-10 MED ORDER — MIDAZOLAM HCL 2 MG/2ML IJ SOLN
INTRAMUSCULAR | Status: AC
  Filled 2022-05-10: qty 2

## 2022-05-10 MED ORDER — BACITRACIN ZINC 500 UNIT/GM EX OINT
TOPICAL_OINTMENT | CUTANEOUS | Status: DC | PRN
  Administered 2022-05-10: 1 via TOPICAL

## 2022-05-10 MED ORDER — LABETALOL HCL 5 MG/ML IV SOLN
INTRAVENOUS | Status: AC
  Filled 2022-05-10: qty 4

## 2022-05-10 MED ORDER — DEXAMETHASONE SODIUM PHOSPHATE 10 MG/ML IJ SOLN
INTRAMUSCULAR | Status: AC
  Filled 2022-05-10: qty 1

## 2022-05-10 MED ORDER — HYDRALAZINE HCL 20 MG/ML IJ SOLN
10.0000 mg | Freq: Once | INTRAMUSCULAR | Status: AC
  Administered 2022-05-10: 10 mg via INTRAVENOUS

## 2022-05-10 MED ORDER — DOCUSATE SODIUM 100 MG PO CAPS
100.0000 mg | ORAL_CAPSULE | Freq: Two times a day (BID) | ORAL | Status: DC
  Administered 2022-05-10 – 2022-05-13 (×4): 100 mg via ORAL
  Filled 2022-05-10 (×5): qty 1

## 2022-05-10 MED ORDER — BACITRACIN ZINC 500 UNIT/GM EX OINT
TOPICAL_OINTMENT | CUTANEOUS | Status: AC
  Filled 2022-05-10: qty 28.35

## 2022-05-10 MED ORDER — HYDROMORPHONE HCL 1 MG/ML IJ SOLN
0.5000 mg | INTRAMUSCULAR | Status: DC | PRN
  Administered 2022-05-10 – 2022-05-13 (×9): 0.5 mg via INTRAVENOUS
  Filled 2022-05-10: qty 1
  Filled 2022-05-10 (×2): qty 0.5
  Filled 2022-05-10 (×3): qty 1
  Filled 2022-05-10: qty 0.5
  Filled 2022-05-10: qty 1
  Filled 2022-05-10: qty 0.5

## 2022-05-10 MED ORDER — PHENYLEPHRINE HCL-NACL 20-0.9 MG/250ML-% IV SOLN
INTRAVENOUS | Status: DC | PRN
  Administered 2022-05-10: 40 ug/min via INTRAVENOUS

## 2022-05-10 MED ORDER — FENTANYL CITRATE (PF) 100 MCG/2ML IJ SOLN
INTRAMUSCULAR | Status: AC
  Filled 2022-05-10: qty 2

## 2022-05-10 MED ORDER — ONDANSETRON HCL 4 MG/2ML IJ SOLN
INTRAMUSCULAR | Status: AC
  Filled 2022-05-10: qty 2

## 2022-05-10 SURGICAL SUPPLY — 91 items
BAG COUNTER SPONGE SURGICOUNT (BAG) ×2 IMPLANT
BAND RUBBER #18 3X1/16 STRL (MISCELLANEOUS) IMPLANT
BENZOIN TINCTURE PRP APPL 2/3 (GAUZE/BANDAGES/DRESSINGS) IMPLANT
BLADE CLIPPER SURG (BLADE) ×2 IMPLANT
BLADE SAW GIGLI 16 STRL (MISCELLANEOUS) IMPLANT
BLADE SURG 15 STRL LF DISP TIS (BLADE) IMPLANT
BLADE SURG 15 STRL SS (BLADE)
BNDG GAUZE DERMACEA FLUFF 4 (GAUZE/BANDAGES/DRESSINGS) IMPLANT
BNDG STRETCH 4X75 STRL LF (GAUZE/BANDAGES/DRESSINGS) IMPLANT
BUR ACORN 9.0 PRECISION (BURR) ×2 IMPLANT
BUR ROUND PRECISION 4.0 (BURR) IMPLANT
BUR SPIRAL ROUTER 2.3 (BUR) ×2 IMPLANT
CANISTER SUCT 3000ML PPV (MISCELLANEOUS) ×4 IMPLANT
CATH VENTRIC 35X38 W/TROCAR LG (CATHETERS) IMPLANT
CLIP VESOCCLUDE MED 6/CT (CLIP) IMPLANT
CNTNR URN SCR LID CUP LEK RST (MISCELLANEOUS) ×2 IMPLANT
CONT SPEC 4OZ STRL OR WHT (MISCELLANEOUS) ×2
COVER BURR HOLE 7 (Orthopedic Implant) IMPLANT
COVER MAYO STAND STRL (DRAPES) IMPLANT
COVERAGE SUPPORT O-ARM STEALTH (MISCELLANEOUS) ×2 IMPLANT
DERMABOND ADVANCED .7 DNX12 (GAUZE/BANDAGES/DRESSINGS) IMPLANT
DRAIN SUBARACHNOID (WOUND CARE) IMPLANT
DRAPE HALF SHEET 40X57 (DRAPES) ×2 IMPLANT
DRAPE MICROSCOPE SLANT 54X150 (MISCELLANEOUS) IMPLANT
DRAPE NEUROLOGICAL W/INCISE (DRAPES) ×2 IMPLANT
DRAPE STERI IOBAN 125X83 (DRAPES) IMPLANT
DRAPE SURG 17X23 STRL (DRAPES) IMPLANT
DRAPE WARM FLUID 44X44 (DRAPES) ×2 IMPLANT
DRSG ADAPTIC 3X8 NADH LF (GAUZE/BANDAGES/DRESSINGS) IMPLANT
DRSG TELFA 3X8 NADH STRL (GAUZE/BANDAGES/DRESSINGS) IMPLANT
DURAPREP 6ML APPLICATOR 50/CS (WOUND CARE) ×2 IMPLANT
ELECT REM PT RETURN 9FT ADLT (ELECTROSURGICAL) ×2
ELECTRODE REM PT RTRN 9FT ADLT (ELECTROSURGICAL) ×2 IMPLANT
EVACUATOR 1/8 PVC DRAIN (DRAIN) IMPLANT
EVACUATOR SILICONE 100CC (DRAIN) IMPLANT
FEE COVERAGE SUPPORT O-ARM (MISCELLANEOUS) ×2 IMPLANT
FORCEPS BIPOLAR SPETZLER 8 1.0 (NEUROSURGERY SUPPLIES) ×2 IMPLANT
GAUZE 4X4 16PLY ~~LOC~~+RFID DBL (SPONGE) IMPLANT
GAUZE SPONGE 4X4 12PLY STRL (GAUZE/BANDAGES/DRESSINGS) IMPLANT
GLOVE BIO SURGEON STRL SZ7 (GLOVE) IMPLANT
GLOVE BIOGEL PI IND STRL 7.0 (GLOVE) IMPLANT
GLOVE BIOGEL PI IND STRL 7.5 (GLOVE) ×2 IMPLANT
GLOVE ECLIPSE 7.5 STRL STRAW (GLOVE) ×2 IMPLANT
GLOVE EXAM NITRILE LRG STRL (GLOVE) IMPLANT
GLOVE EXAM NITRILE XS STR PU (GLOVE) IMPLANT
GOWN STRL REUS W/ TWL LRG LVL3 (GOWN DISPOSABLE) ×4 IMPLANT
GOWN STRL REUS W/ TWL XL LVL3 (GOWN DISPOSABLE) IMPLANT
GOWN STRL REUS W/TWL 2XL LVL3 (GOWN DISPOSABLE) IMPLANT
GOWN STRL REUS W/TWL LRG LVL3 (GOWN DISPOSABLE) ×4
GOWN STRL REUS W/TWL XL LVL3 (GOWN DISPOSABLE)
HEMOSTAT POWDER KIT SURGIFOAM (HEMOSTASIS) ×2 IMPLANT
HEMOSTAT SURGICEL 2X14 (HEMOSTASIS) ×2 IMPLANT
IV NS 1000ML (IV SOLUTION)
IV NS 1000ML BAXH (IV SOLUTION) ×2 IMPLANT
KIT BASIN OR (CUSTOM PROCEDURE TRAY) ×2 IMPLANT
KIT DRAIN CSF ACCUDRAIN (MISCELLANEOUS) IMPLANT
KIT TURNOVER KIT B (KITS) ×2 IMPLANT
MARKER SPHERE PSV REFLC NDI (MISCELLANEOUS) ×6 IMPLANT
NDL SPNL 18GX3.5 QUINCKE PK (NEEDLE) IMPLANT
NEEDLE HYPO 22GX1.5 SAFETY (NEEDLE) ×2 IMPLANT
NEEDLE SPNL 18GX3.5 QUINCKE PK (NEEDLE) IMPLANT
NS IRRIG 1000ML POUR BTL (IV SOLUTION) ×6 IMPLANT
PACK CRANIOTOMY CUSTOM (CUSTOM PROCEDURE TRAY) ×2 IMPLANT
PATTIES SURGICAL .25X.25 (GAUZE/BANDAGES/DRESSINGS) IMPLANT
PATTIES SURGICAL .5 X.5 (GAUZE/BANDAGES/DRESSINGS) IMPLANT
PATTIES SURGICAL .5 X3 (DISPOSABLE) IMPLANT
PATTIES SURGICAL 1/4 X 3 (GAUZE/BANDAGES/DRESSINGS) IMPLANT
PATTIES SURGICAL 1X1 (DISPOSABLE) IMPLANT
PIN MAYFIELD SKULL DISP (PIN) ×2 IMPLANT
PLATE DOUBLE Y CMF 6H (Plate) IMPLANT
SCREW UNIII AXS SD 1.5X4 (Screw) IMPLANT
SPECIMEN JAR SMALL (MISCELLANEOUS) IMPLANT
SPIKE FLUID TRANSFER (MISCELLANEOUS) ×2 IMPLANT
SPONGE NEURO XRAY DETECT 1X3 (DISPOSABLE) IMPLANT
SPONGE SURGIFOAM ABS GEL 100 (HEMOSTASIS) ×2 IMPLANT
STAPLER VISISTAT 35W (STAPLE) ×2 IMPLANT
SUT ETHILON 3 0 FSL (SUTURE) IMPLANT
SUT ETHILON 3 0 PS 1 (SUTURE) IMPLANT
SUT MNCRL AB 3-0 PS2 18 (SUTURE) IMPLANT
SUT MON AB 3-0 SH 27 (SUTURE)
SUT MON AB 3-0 SH27 (SUTURE) IMPLANT
SUT NURALON 4 0 TR CR/8 (SUTURE) ×6 IMPLANT
SUT SILK 0 TIES 10X30 (SUTURE) IMPLANT
SUT VIC AB 2-0 CP2 18 (SUTURE) ×2 IMPLANT
TOWEL GREEN STERILE (TOWEL DISPOSABLE) ×2 IMPLANT
TOWEL GREEN STERILE FF (TOWEL DISPOSABLE) ×2 IMPLANT
TRAY FOLEY MTR SLVR 16FR STAT (SET/KITS/TRAYS/PACK) ×2 IMPLANT
TUBE CONNECTING 12X1/4 (SUCTIONS) ×2 IMPLANT
TUBING FEATHERFLOW (TUBING) IMPLANT
UNDERPAD 30X36 HEAVY ABSORB (UNDERPADS AND DIAPERS) ×2 IMPLANT
WATER STERILE IRR 1000ML POUR (IV SOLUTION) ×2 IMPLANT

## 2022-05-10 NOTE — Progress Notes (Signed)
Dr. Marcie Bal made aware of patient's elevated BP readings this am.

## 2022-05-10 NOTE — Transfer of Care (Signed)
Immediate Anesthesia Transfer of Care Note  Patient: Sean Carrillo.  Procedure(s) Performed: Right craniotomy for tumor resection (Right) APPLICATION OF CRANIAL NAVIGATION  Patient Location: PACU  Anesthesia Type:General  Level of Consciousness: awake and drowsy  Airway & Oxygen Therapy: Patient Spontanous Breathing  Post-op Assessment: Report given to RN  Post vital signs: stable  Last Vitals:  Vitals Value Taken Time  BP 161/115 05/10/22 1148  Temp    Pulse 65 05/10/22 1155  Resp 13 05/10/22 1153  SpO2 91 % 05/10/22 1155  Vitals shown include unvalidated device data.  Last Pain:  Vitals:   05/10/22 0604  PainSc: 0-No pain      Patients Stated Pain Goal: 0 (16/10/96 0454)  Complications: No notable events documented.

## 2022-05-10 NOTE — H&P (Signed)
Surgical H&P Update  HPI: 57 y.o. with a history of some presyncopal / paroxysmal issues that prompted imaging, which discovered a skull base mass, here for open craniotomy and biopsy today. No changes in health since they were last seen.   PMHx:  Past Medical History:  Diagnosis Date   Brain tumor ([place])    Hyperlipidemia    Hypertension    Paralysis ([place])    FamHx:  Family History  Problem Relation Age of Onset   Hypertension Mother    Heart attack Father        Age 9   SocHx:  reports that he has never smoked. He has never used smokeless tobacco. He reports that he does not drink alcohol and does not use drugs.  Physical Exam: Aox3, PERRL, EOMI, FS & SS, strength 5/5x4, no drift  Assesment/Plan: 57 y.o. man with symptomatic skull base mass, here for right craniotomy and open biopsy. Risks, benefits, and alternatives discussed and the patient would like to continue with surgery.  -OR today -4N ICU post-op  Judith Part, MD 05/10/22 7:48 AM

## 2022-05-10 NOTE — Anesthesia Procedure Notes (Signed)
Arterial Line Insertion Start/End1/12/2022 6:45 AM, 05/10/2022 6:50 AM Performed by: Carolan Clines, CRNA, CRNA  Patient location: Pre-op. Preanesthetic checklist: patient identified, IV checked, site marked, risks and benefits discussed, surgical consent, monitors and equipment checked, pre-op evaluation, timeout performed and anesthesia consent Lidocaine 1% used for infiltration Right, radial was placed Catheter size: 20 G Hand hygiene performed  and maximum sterile barriers used   Attempts: 1 Procedure performed without using ultrasound guided technique. Following insertion, dressing applied and Biopatch. Post procedure assessment: normal and unchanged  Patient tolerated the procedure well with no immediate complications.

## 2022-05-10 NOTE — Op Note (Signed)
PATIENT: Sean Carrillo.  DAY OF SURGERY: 05/10/22   PRE-OPERATIVE DIAGNOSIS:  Central skull base tumor   POST-OPERATIVE DIAGNOSIS:  Same   PROCEDURE:  Right craniotomy for open tumor biopsy, use of frameless stereotaxy   SURGEON:  Surgeon(s) and Role:    Judith Part, MD - Primary    Norm Parcel PA  - Assisting   ANESTHESIA: ETGA   BRIEF HISTORY: This is a 57 year old man who presented with some paroxysmal alterations in mental status, workup showed a large central skull base mass that extended from the anterior to posterior fossa. There was some equipose as to the underlying pathology, I therefore recommended an open biopsy to guide treatment. This was discussed with the patient as well as risks, benefits, and alternatives and wished to proceed with surgery.   OPERATIVE DETAIL: The patient was taken to the operating room and placed on the OR table in the supine position. A formal time out was performed with two patient identifiers and confirmed the operative site. Anesthesia was induced by the anesthesia team. The [place] head holder was applied to the head and a registration array was attached to the [place]. This was co-registered with the patient's preoperative imaging, the fit appeared to be acceptable. Using frameless stereotaxy, the operative trajectory was planned and the incision was marked. Hair was clipped with surgical clippers over the incision and the area was then prepped and draped in a sterile fashion.  A linear incision was placed in the right pterional region. A myocutaneous flap was created to minimize the risk of FBFTN palsy and the soft tissues were retracted. A small craniotomy was created in the right pterional region, dura was opened and retracted, CSF was drained.  The Sylvian fissure was split. The tumor was identified and confirmed with stereotaxy. It was dissected off of the optic and ICA to allow for safe partial resection. It was not highly  vascular and had a good cleavage plane. A portion of the tumor was resected and sent to pathology for further analysis. Hemostasis was obtained and confirmed, the dura was closed, and the bone flap was secured with titanium plates and screws.   All instrument and sponge counts were correct, the incision was then closed in layers. The patient was then returned to anesthesia for emergence. No apparent complications at the completion of the procedure.   EBL:  268m   DRAINS: none   SPECIMENS: skull base tumor   TJudith Part MD 05/10/22 8:05 AM

## 2022-05-10 NOTE — Anesthesia Procedure Notes (Addendum)
Procedure Name: Intubation Date/Time: 05/10/2022 8:11 AM  Performed by: Carolan Clines, CRNAPre-anesthesia Checklist: Patient identified, Emergency Drugs available, Suction available and Patient being monitored Patient Re-evaluated:Patient Re-evaluated prior to induction Oxygen Delivery Method: Circle System Utilized Preoxygenation: Pre-oxygenation with 100% oxygen Induction Type: IV induction Ventilation: Mask ventilation without difficulty and Oral airway inserted - appropriate to patient size Laryngoscope Size: Mac and 4 Grade View: Grade II Tube type: Oral Tube size: 7.5 mm Number of attempts: 1 Airway Equipment and Method: Stylet and Oral airway Placement Confirmation: ETT inserted through vocal cords under direct vision, positive ETCO2 and breath sounds checked- equal and bilateral Secured at: 23 cm Tube secured with: Tape Dental Injury: Teeth and Oropharynx as per pre-operative assessment  Comments: Intubation done by Donnie Mesa SRNA under direct supervision of CRNA.

## 2022-05-11 ENCOUNTER — Telehealth: Payer: Self-pay | Admitting: Cardiology

## 2022-05-11 ENCOUNTER — Inpatient Hospital Stay (HOSPITAL_COMMUNITY): Payer: BC Managed Care – PPO

## 2022-05-11 LAB — TROPONIN I (HIGH SENSITIVITY)
Troponin I (High Sensitivity): 5 ng/L (ref <18)
Troponin I (High Sensitivity): 6 ng/L (ref <18)

## 2022-05-11 MED ORDER — OXYCODONE-ACETAMINOPHEN 5-325 MG PO TABS
1.0000 | ORAL_TABLET | ORAL | Status: DC | PRN
  Administered 2022-05-11 – 2022-05-13 (×11): 1 via ORAL
  Filled 2022-05-11 (×11): qty 1

## 2022-05-11 MED ORDER — SODIUM CHLORIDE 0.9 % IV SOLN: INTRAVENOUS | Status: DC

## 2022-05-11 NOTE — Telephone Encounter (Signed)
Patient's wife called to say her husband is in the hospital, he had his surgery yesterday to remove his tumor.  He is having issues with his heart rate dropping.  PT got him out of bed today into a chair, while in the chair he passed out.  Wife stated he felt it coming on when is passed out.  She is concerned because these are issues he has had in the passed, and the doctors in the hospital are not communicating with Dr. Stanford Breed.  She wanted to make Dr. Stanford Breed aware of what is going on.

## 2022-05-11 NOTE — Evaluation (Signed)
Occupational Therapy Evaluation Patient Details Name: Sean Carrillo. MRN: [id number] DOB: 02-23-1966 Today's Date: 05/11/2022   History of Present Illness 57 yo male s/p right craniotomy biopsy taken 05/10/22 PMH HTN HLD   Clinical Impression   Patient is s/p R craniotomy surgery resulting in functional limitations due to the deficits listed below (see OT problem list). 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 Pt noted to have decreased HR 38 with LOC during session after strong cough with production of secretion. Pt with eyes open and R deviated at that time with delayed response to staff even with sternal rub. Pt regaining consciousness and able to answer orientation questions. Pt reports "yes" when asked if he felt himself having LOC. Pt provided no warning to staff. Pt able to transfer well but required two person (A) due to HR decrease fall risk. Wife present throughout session.   Patient will benefit from skilled OT acutely to increase independence and safety with ADLS to allow discharge outpatient likely with progression .     Recommendations for follow up therapy are one component of a multi-disciplinary discharge planning process, led by the attending physician.  Recommendations may be updated based on patient status, additional functional criteria and insurance authorization.   Follow Up Recommendations  Outpatient OT     Assistance Recommended at Discharge Intermittent Supervision/Assistance  Patient can return home with the following A little help with walking and/or transfers;A little help with bathing/dressing/bathroom    Functional Status Assessment  Patient has had a recent decline in their functional status and demonstrates the ability to make significant improvements in function in a reasonable and predictable amount of time.  Equipment Recommendations  None recommended by OT    Recommendations for Other Services       Precautions  / Restrictions Precautions Precautions: Fall Precaution Comments: watch HR closely as he Bradycardic      Mobility Bed Mobility Overal bed mobility: Needs Assistance Bed Mobility: Supine to Sit, Rolling Rolling: Supervision   Supine to sit: Min assist     General bed mobility comments: pt with (A) to elevate trunk from elevated HOB 45 degrees . pt able to sustain static sitting good balance    Transfers Overall transfer level: Needs assistance Equipment used: 1 person hand held assist Transfers: Sit to/from Stand Sit to Stand: Min assist           General transfer comment: pt able to power up from bed surface with bil LE bracing on bed initially . pt progressed around bed to chair. pt sitting in chair and then noted to have HR 38 LOC eyes open and R deviation. pt with sternal rub and returning to focused attention.      Balance Overall balance assessment: Needs assistance Sitting-balance support: Bilateral upper extremity supported, Feet supported Sitting balance-Leahy Scale: Good     Standing balance support: Single extremity supported, During functional activity Standing balance-Leahy Scale: Fair                             ADL either performed or assessed with clinical judgement   ADL Overall ADL's : Needs assistance/impaired Eating/Feeding: Minimal assistance;Sitting   Grooming: Minimal assistance;Sitting   Upper Body Bathing: Minimal assistance;Sitting   Lower Body Bathing: Moderate assistance;Sit to/from stand   Upper Body Dressing : Minimal assistance;Sitting   Lower Body Dressing: Moderate assistance;Sit to/from stand   Toilet Transfer: +2 for physical assistance;Minimal  assistance;Ambulation (hand held (A))           Functional mobility during ADLs: +2 for physical assistance;Minimal assistance (hand held (A))       Vision Baseline Vision/History: 0 No visual deficits Patient Visual Report: Diplopia;Blurring of vision Vision  Assessment?: Yes Diplopia Assessment: Objects split on top of one another;Present all the time/all directions Additional Comments: sensitive to light, prefers cool rag on head at [facility] stime. difficult to fully assess as patient is keeping eyes closed. Due to LOC and HR 38 in chair was not further assessed at this time.     Perception     Praxis      Pertinent Vitals/Pain Pain Assessment Pain Assessment: 0-10 Pain Score: 7  Pain Location: head Pain Descriptors / Indicators: Headache Pain Intervention(s): Monitored during session, Premedicated before session, Repositioned, Limited activity within patient's tolerance (wife reports number has not changed at all throughout stay. She states its staying a 7)     Hand Dominance Right   Extremity/Trunk Assessment Upper Extremity Assessment Upper Extremity Assessment: Overall WFL for tasks assessed   Lower Extremity Assessment Lower Extremity Assessment: Overall WFL for tasks assessed   Cervical / Trunk Assessment Cervical / Trunk Assessment: Normal   Communication Communication Communication: Expressive difficulties (very soft spoken can voice but is using hand motions like thumbs up instead of talking)   Cognition Arousal/Alertness: Awake/alert Behavior During Therapy: Flat affect                                         General Comments  RA sitting HR 52 and HR bradycardic to 38 BP 150/89 RR 17    Exercises     Shoulder Instructions      Home Living Family/patient expects to be discharged to:: Private residence Living Arrangements: Spouse/significant other Available Help at Discharge: Available 24 hours/day;Family (wife works from home) Type of Home: House Home Access: Stairs to enter Technical brewer of Steps: 2 Entrance Stairs-Rails: None Home Layout: Two level;1/2 bath on main level;Bed/bath upstairs;Able to live on main level with bedroom/bathroom (could make staying on the main level)      Bathroom Shower/Tub: Teacher, early years/pre: Handicapped height     Home Equipment: None   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, 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, drives to [place], [place], [place] for jobs instead of [place], has a Magazine features editor at home      Prior Functioning/Environment Prior Level of Function : Working/employed;Driving;Independent/Modified Independent                        OT Problem List: Decreased activity tolerance;Impaired balance (sitting and/or standing);Impaired vision/perception;Decreased cognition;Decreased safety awareness;Decreased knowledge of use of DME or AE;Decreased knowledge of precautions;Cardiopulmonary status limiting activity      OT Treatment/Interventions: Self-care/ADL training;Therapeutic exercise;Neuromuscular education;Energy conservation;DME and/or AE instruction;Manual therapy;Modalities;Therapeutic activities;Cognitive remediation/compensation;Visual/perceptual remediation/compensation;Patient/family education;Balance training    OT Goals(Current goals can be found in the care plan section) Acute Rehab OT Goals Patient Stated Goal: none stated by patient. wife reports work obligations in Feb OT Goal Formulation: With patient/family Time For Goal Achievement: 05/25/22 Potential to Achieve Goals: Good  OT Frequency: Min 2X/week    Co-evaluation PT/OT/SLP Co-Evaluation/Treatment: Yes Reason for Co-Treatment: Complexity of the patient's impairments (multi-system involvement);For patient/therapist safety;To address functional/ADL transfers   OT goals addressed during session: ADL's and self-care;Proper use of  Adaptive equipment and DME;Strengthening/ROM      AM-PAC OT "6 Clicks" Daily Activity     Outcome Measure Help from another person eating meals?: A Little Help from another person taking care of personal grooming?: A Little Help from another person toileting, which includes using toliet, bedpan, or  urinal?: A Lot Help from another person bathing (including washing, rinsing, drying)?: A Lot Help from another person to put on and taking off regular upper body clothing?: A Little Help from another person to put on and taking off regular lower body clothing?: A Lot 6 Click Score: 15   End of Session Nurse Communication: Mobility status;Precautions  Activity Tolerance: Patient tolerated treatment well Patient left: in chair;with call bell/phone within reach;with chair alarm set;with family/visitor present  OT Visit Diagnosis: Unsteadiness on feet (R26.81);Muscle weakness (generalized) (M62.81)                Time: 2010-0712 OT Time Calculation (min): 25 min Charges:  OT General Charges $OT Visit: 1 Visit OT Evaluation $OT Eval Moderate Complexity: 1 Mod   Brynn, OTR/L  Acute Rehabilitation Services Office: [phone] .   Jeri Modena 05/11/2022, 12:07 PM

## 2022-05-11 NOTE — Progress Notes (Signed)
12:06-Reached out to Dr. Zada Finders via text as it was during business/clinical hours. The patient HR is dropping into the 30s, concerns for the patient transferring out of the ICU, optimal pain control, and a request for a cardio consult was requested.   14:52- After no response I reached out to [facility] regarding the concerns for the. I voiced that the patient wife had reached out to the patients cardiologist. Orders were given for a head CT, EKG and IVF. Ebony Hail PA voiced that once the CT and EKG are reviewed if needed she will reach out to Cardio.

## 2022-05-11 NOTE — Telephone Encounter (Signed)
Spoke to wife (ok per DPR)-states patients heart rate was dropping during the night but had episode during PT and patient passed out.     Per PT note:  Once pt was seated in the chair he started coughing and his HR dropped to 38 and pt with +LOC, LEs elevated, and sternal rub given and pt returned to alertness. Pt reported feeling the sensation of passing out coming on. RN notified and came to assess the situation.   Per chart review:  1 presyncope/syncope-previous echocardiogram showed normal LV function.  He has not had recurrences.  Can consider implantable loop in the future if episodes become more frequent.    Advised to discuss cardiology consult with nurse and/or MD.  Also advised would send message to Dr. Stanford Breed to make aware.

## 2022-05-11 NOTE — Progress Notes (Addendum)
Neurosurgery Service Progress Note  Subjective: No acute events overnight. He complains of a headache and throbbing at the incision. He had n/v this morning with hydrocodone, but he had not eaten.    Objective: Vitals:   05/11/22 0400 05/11/22 0500 05/11/22 0600 05/11/22 0700  BP: 137/82 (!) 138/90 128/77 126/87  Pulse: 72 78 66 76  Resp:   17 20  Temp: 98.6 F (37 C)     TempSrc: Oral     SpO2: 95% 96% 94% 94%  Weight:      Height:        Physical Exam: AAOx3, Strength 5/5 x4,  SILTx4, extraoculars intact, face symmetric, incision c/d/I   Assessment & Plan: 57 y.o. male s/p right craniotomy, recovering well.  -Advance diet as tolerated  -PT/OT recs -will d/c hydrocodone and switch to oxycodone  -Will transfer to 4NP -D/c foley   Norm Parcel, PA-C  05/11/22 8:20 AM

## 2022-05-11 NOTE — Evaluation (Signed)
Physical Therapy Evaluation Patient Details Name: Sean Carrillo. MRN: [id number] DOB: 1966-03-22 Today's Date: 05/11/2022  History of Present Illness  57 yo male s/p right craniotomy biopsy taken 05/10/22 PMH HTN HLD  Clinical Impression  Pt admitted with above. Pt with c/o headache, nausea and sleepiness from pain medicine. Pt mobilizing well considering and was able to ambulate around the bed with minAx2 for line management. Once pt was seated in the chair he started coughing and his HR dropped to 38 and pt with +LOC, LEs elevated, and sternal rub given and pt returned to alertness. Pt reported feeling the sensation of passing out coming on. RN notified and came to assess the situation. Suspect once HR and pain under control pt will progress well from functional stand point. Acute PT to cont to follow.       Recommendations for follow up therapy are one component of a multi-disciplinary discharge planning process, led by the attending physician.  Recommendations may be updated based on patient status, additional functional criteria and insurance authorization.  Follow Up Recommendations Outpatient PT (may progress and not need it)      Assistance Recommended at Discharge Frequent or constant Supervision/Assistance  Patient can return home with the following       Equipment Recommendations None recommended by PT  Recommendations for Other Services       Functional Status Assessment Patient has had a recent decline in their functional status and demonstrates the ability to make significant improvements in function in a reasonable and predictable amount of time.     Precautions / Restrictions Precautions Precautions: Fall Precaution Comments: watch HR closely as he Bradycardic, dropped to 38 and passed out Restrictions Weight Bearing Restrictions: No      Mobility  Bed Mobility Overal bed mobility: Needs Assistance Bed Mobility: Supine to Sit, Rolling Rolling:  Supervision   Supine to sit: Min assist     General bed mobility comments: pt with (A) to elevate trunk from elevated HOB 45 degrees . pt able to sustain static sitting good balance    Transfers Overall transfer level: Needs assistance Equipment used: 1 person hand held assist Transfers: Sit to/from Stand Sit to Stand: Min assist                Ambulation/Gait Ambulation/Gait assistance: Min assist, +2 safety/equipment, +2 physical assistance Gait Distance (Feet): 10 Feet Assistive device: 2 person hand held assist Gait Pattern/deviations: Step-through pattern, Decreased stride length Gait velocity: slow Gait velocity interpretation: <1.31 ft/sec, indicative of household ambulator   General Gait Details: pt requiring bilat HHA for safe ambulation around the bed. pt with noted episode of swaying backwards, pt slow, guarded, and cautious. pt sat in chair and started coughing, then noted to have HR 38 LOC eyes open and R deviation. pt with sternal rub and returning to focused attention. HR back up to 70s, settling in 50s  Stairs            Wheelchair Mobility    Modified Rankin (Stroke Patients Only)       Balance Overall balance assessment: Needs assistance Sitting-balance support: Bilateral upper extremity supported, Feet supported Sitting balance-Leahy Scale: Good     Standing balance support: Single extremity supported, During functional activity Standing balance-Leahy Scale: Fair Standing balance comment: pt limited by sleepiness from pain meds and headache pain  Pertinent Vitals/Pain Pain Assessment Pain Assessment: 0-10 Pain Score: 7  Pain Location: head Pain Descriptors / Indicators: Headache Pain Intervention(s): Monitored during session    Home Living Family/patient expects to be discharged to:: Private residence Living Arrangements: Spouse/significant other Available Help at Discharge: Available 24  hours/day;Family (wife works from home) Type of Home: House Home Access: Stairs to enter Entrance Stairs-Rails: None Entrance Stairs-Number of Steps: 2 Alternate Level Stairs-Number of Steps: flight Home Layout: Two level;1/2 bath on main level;Bed/bath upstairs;Able to live on main level with bedroom/bathroom (could make staying on the main level) Home Equipment: None 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, 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, drives to [place], [place], [place] for jobs instead of [place], has a Magazine features editor at home    Prior Function Prior Level of Function : Working/employed;Driving;Independent/Modified Independent             Mobility Comments: indep ADLs Comments: indep     Hand Dominance   Dominant Hand: Right    Extremity/Trunk Assessment   Upper Extremity Assessment Upper Extremity Assessment: Overall WFL for tasks assessed    Lower Extremity Assessment Lower Extremity Assessment: Overall WFL for tasks assessed    Cervical / Trunk Assessment Cervical / Trunk Assessment: Normal  Communication   Communication: Expressive difficulties (very soft spoken can voice but is using hand motions like thumbs up instead of talking)  Cognition Arousal/Alertness: Awake/alert Behavior During Therapy: Flat affect                                            General Comments General comments (skin integrity, edema, etc.): HR 50-70s at rest, dropped to 38 s/p 10' of ambulation and coughing, +LOC, BP 150/89, RR 17    Exercises     Assessment/Plan    PT Assessment Patient needs continued PT services  PT Problem List Decreased activity tolerance;Decreased balance;Decreased mobility;Cardiopulmonary status limiting activity       PT Treatment Interventions DME instruction;Gait training;Stair training;Functional mobility training;Therapeutic activities;Therapeutic exercise;Balance training    PT Goals (Current goals can be found in the  Care Plan section)  Acute Rehab PT Goals Patient Stated Goal: stop the pain PT Goal Formulation: With patient/family Time For Goal Achievement: 05/25/22 Potential to Achieve Goals: Good Additional Goals Additional Goal #1: Pt to score >19 on DGI to indicate minimal falls risk.    Frequency Min 4X/week     Co-evaluation PT/OT/SLP Co-Evaluation/Treatment: Yes Reason for Co-Treatment: Complexity of the patient's impairments (multi-system involvement) PT goals addressed during session: Mobility/safety with mobility OT goals addressed during session: ADL's and self-care;Proper use of Adaptive equipment and DME;Strengthening/ROM       AM-PAC PT "6 Clicks" Mobility  Outcome Measure Help needed turning from your back to your side while in a flat bed without using bedrails?: A Little Help needed moving from lying on your back to sitting on the side of a flat bed without using bedrails?: A Little Help needed moving to and from a bed to a chair (including a wheelchair)?: A Little Help needed standing up from a chair using your arms (e.g., wheelchair or bedside chair)?: A Little Help needed to walk in hospital room?: A Lot Help needed climbing 3-5 steps with a railing? : A Lot 6 Click Score: 16    End of Session   Activity Tolerance: Patient limited by pain Patient left: in chair;with call bell/phone  within reach;with chair alarm set Nurse Communication: Mobility status (+LOC, HR down to 38) PT Visit Diagnosis: Unsteadiness on feet (R26.81);Muscle weakness (generalized) (M62.81)    Time: 6578-4696 PT Time Calculation (min) (ACUTE ONLY): 31 min   Charges:   PT Evaluation $PT Eval Moderate Complexity: 1 Mod          Kittie Plater, PT, DPT Acute Rehabilitation Services Secure chat preferred Office #: [phone]   Berline Lopes 05/11/2022, 2:32 PM

## 2022-05-12 ENCOUNTER — Telehealth: Payer: Self-pay | Admitting: Cardiology

## 2022-05-12 NOTE — Progress Notes (Signed)
Neurosurgery Service Progress Note  Subjective: No acute events overnight, globally feeling better, HR in the 50s overnight, low 60s this morning, no further episodes, no CP/presyncopal / syncopal Sx  Objective: Vitals:   05/12/22 0500 05/12/22 0600 05/12/22 0700 05/12/22 0800  BP: (!) 167/91 (!) 162/102  (!) 154/93  Pulse: (!) 53 (!) 57 73 64  Resp: '13 14 16 15  '$ Temp:    97.7 F (36.5 C)  TempSrc:    Oral  SpO2: 90% 93% 94% 95%  Weight:      Height:        Physical Exam: Aox3, PERRL, EOMI, FS & SS, strength 5/5x4  Assessment & Plan: 58 y.o. man s/p crani for tumor biopsy, recovering well.  -upon chart review, appears he was symptomatic from the bradycardia yesterday - I was not informed he had a true syncopal episode. CTH shows small SDH and post-op changes, not concerning, EKG with typical findings after intracranial surgery with some worsening bradycardia and non-specific t wave changes, trops neg -discussed with cards, they recommended getting him up to walk today to see if he can mount an appropriate response to balance out vagal tone, if not or still symptomatic then will consult cards for further eval -transfer to stepdown w/ tele -SCDs/TEDs/SQH -diet and activity as tolerated  Marcello Moores A Jakalyn Kratky  05/12/22 9:00 AM

## 2022-05-12 NOTE — Telephone Encounter (Signed)
Patient's wife is calling to talk with Dr. Stanford Breed or nurse. Says that it is very important due to patient having brain surgery and syncope

## 2022-05-12 NOTE — Progress Notes (Signed)
Physical Therapy Treatment Patient Details Name: Sean Carrillo. MRN: [id number] DOB: [birth year] Today's Date: 05/12/2022   History of Present Illness 57 yo male s/p right craniotomy biopsy taken 05/10/22 PMH HTN HLD    PT Comments    Progressing well today.  HR moderated, pt with HA 6/10.  Emphasis on transition, safe sit to stands and progression of gait stability/stamina and speed.  Pt ready to move away from the RW.   Recommendations for follow up therapy are one component of a multi-disciplinary discharge planning process, led by the attending physician.  Recommendations may be updated based on patient status, additional functional criteria and insurance authorization.  Follow Up Recommendations  Outpatient PT     Assistance Recommended at Discharge Intermittent Supervision/Assistance  Patient can return home with the following A little help with walking and/or transfers;A little help with bathing/dressing/bathroom;Assistance with cooking/housework;Assist for transportation;Help with stairs or ramp for entrance   Equipment Recommendations  None recommended by PT    Recommendations for Other Services       Precautions / Restrictions Precautions Precautions: Fall Precaution Comments: watch HR closely as he Bradycardic, dropped to 38 and passed out Restrictions Weight Bearing Restrictions: No     Mobility  Bed Mobility Overal bed mobility: Needs Assistance Bed Mobility: Supine to Sit, Sit to Supine     Supine to sit: Min assist Sit to supine: Min guard   General bed mobility comments: pt positioned himself in bed without assist    Transfers Overall transfer level: Needs assistance   Transfers: Sit to/from Stand Sit to Stand: Min assist           General transfer comment: very light min steady assist    Ambulation/Gait Ambulation/Gait assistance: Min guard Gait Distance (Feet): 170 Feet Assistive device: Rolling walker (2 wheels) Gait  Pattern/deviations: Step-through pattern   Gait velocity interpretation: <1.8 ft/sec, indicate of risk for recurrent falls   General Gait Details: pt generally steady, able to scan his environment though guarded and generally slower.   Stairs             Wheelchair Mobility    Modified Rankin (Stroke Patients Only)       Balance   Sitting-balance support: No upper extremity supported Sitting balance-Leahy Scale: Good       Standing balance-Leahy Scale: Fair                              Cognition Arousal/Alertness: Awake/alert Behavior During Therapy: Flat affect Overall Cognitive Status: Within Functional Limits for tasks assessed                                          Exercises      General Comments General comments (skin integrity, edema, etc.): HR with activity 72 bpm, sats on RA 96%      Pertinent Vitals/Pain Pain Assessment Pain Assessment: Faces Faces Pain Scale: Hurts even more Pain Location: head Pain Descriptors / Indicators: Headache Pain Intervention(s): Monitored during session    Home Living                          Prior Function            PT Goals (current goals can now be found in the care plan section) Acute Rehab  PT Goals Patient Stated Goal: stop the pain PT Goal Formulation: With patient/family Time For Goal Achievement: 05/25/22 Potential to Achieve Goals: Good Progress towards PT goals: Progressing toward goals    Frequency    Min 4X/week      PT Plan Current plan remains appropriate    Co-evaluation              AM-PAC PT "6 Clicks" Mobility   Outcome Measure  Help needed turning from your back to your side while in a flat bed without using bedrails?: A Little Help needed moving from lying on your back to sitting on the side of a flat bed without using bedrails?: A Little Help needed moving to and from a bed to a chair (including a wheelchair)?: A Little Help  needed standing up from a chair using your arms (e.g., wheelchair or bedside chair)?: A Little Help needed to walk in hospital room?: A Little Help needed climbing 3-5 steps with a railing? : A Little 6 Click Score: 18    End of Session   Activity Tolerance: Patient tolerated treatment well;Patient limited by pain Patient left: in bed;with call bell/phone within reach;with family/visitor present;with bed alarm set Nurse Communication: Mobility status PT Visit Diagnosis: Other abnormalities of gait and mobility (R26.89);Pain Pain - part of body:  (headache)     Time: 6168-3729 PT Time Calculation (min) (ACUTE ONLY): 21 min  Charges:  $Gait Training: 8-22 mins                     05/12/2022  Ginger Carne., PT Acute Rehabilitation Services [phone]  (office)   Tessie Fass Aakash Hollomon 05/12/2022, 5:56 PM

## 2022-05-12 NOTE — Anesthesia Postprocedure Evaluation (Signed)
Anesthesia Post Note  Patient: Sean Carrillo.  Procedure(s) Performed: Right craniotomy for tumor resection (Right) APPLICATION OF CRANIAL NAVIGATION     Patient location during evaluation: PACU Anesthesia Type: General Level of consciousness: awake and alert Pain management: pain level controlled Vital Signs Assessment: post-procedure vital signs reviewed and stable Respiratory status: spontaneous breathing, nonlabored ventilation, respiratory function stable and patient connected to nasal cannula oxygen Cardiovascular status: blood pressure returned to baseline and stable Postop Assessment: no apparent nausea or vomiting Anesthetic complications: no   No notable events documented.  Last Vitals:  Vitals:   05/12/22 0700 05/12/22 0800  BP:  (!) 154/93  Pulse: 73 64  Resp: 16 15  Temp:  36.5 C  SpO2: 94% 95%    Last Pain:  Vitals:   05/12/22 0839  TempSrc:   PainSc: [place]

## 2022-05-12 NOTE — Progress Notes (Signed)
Pt transferred to 4NP-14 from 4N ICU with wife at bedside. Pt c/o pain to R side of head 6/10. Cold wash cloth applied to forehead for comfort. Assessment and VS documented. Pt oriented to unit. Call bell within reach, bed alarm in place.

## 2022-05-12 NOTE — Telephone Encounter (Signed)
Pt c/o Syncope: STAT if syncope occurred within 30 minutes and pt complains of lightheadedness High Priority if episode of passing out, completely, today or in last 24 hours   Did you pass out today? No    When is the last time you passed out? Last 24 hours   Has this occurred multiple times? Yes    Did you have any symptoms prior to passing out? lightheadedness

## 2022-05-12 NOTE — Telephone Encounter (Signed)
Attempted to return call to patient's wife- unable to reach. Left message to call back.

## 2022-05-13 ENCOUNTER — Other Ambulatory Visit: Payer: Self-pay | Admitting: Physician Assistant

## 2022-05-13 ENCOUNTER — Encounter (HOSPITAL_COMMUNITY): Payer: Self-pay | Admitting: Neurological Surgery

## 2022-05-13 ENCOUNTER — Inpatient Hospital Stay (INDEPENDENT_AMBULATORY_CARE_PROVIDER_SITE_OTHER): Payer: BC Managed Care – PPO

## 2022-05-13 DIAGNOSIS — R001 Bradycardia, unspecified: Secondary | ICD-10-CM | POA: Diagnosis not present

## 2022-05-13 DIAGNOSIS — R55 Syncope and collapse: Secondary | ICD-10-CM | POA: Diagnosis not present

## 2022-05-13 MED ORDER — AMLODIPINE BESYLATE 5 MG PO TABS
5.0000 mg | ORAL_TABLET | Freq: Every day | ORAL | Status: DC
  Administered 2022-05-13: 5 mg via ORAL
  Filled 2022-05-13: qty 1

## 2022-05-13 MED ORDER — AMLODIPINE BESYLATE 5 MG PO TABS: 5.0000 mg | ORAL_TABLET | Freq: Every day | ORAL | 2 refills | Status: DC

## 2022-05-13 MED ORDER — OXYCODONE-ACETAMINOPHEN 5-325 MG PO TABS: 1.0000 | ORAL_TABLET | ORAL | 0 refills | Status: DC | PRN

## 2022-05-13 NOTE — Progress Notes (Signed)
Neurosurgery Service Progress Note  Subjective: No acute events overnight, globally again better, HR still lower and presyncopal when standing, symptoms do improve with time / mobilization but HR not increasing significantly with walking  Objective: Vitals:   05/12/22 2307 05/13/22 0309 05/13/22 0811 05/13/22 1159  BP: (!) 149/96  (!) 170/82 (!) 152/87  Pulse:   (!) 59 (!) 57  Resp:  '20 16 18  '$ Temp: 98.9 F (37.2 C) 99 F (37.2 C) 99.2 F (37.3 C) 98.9 F (37.2 C)  TempSrc: Oral Oral Oral Oral  SpO2:   94% 94%  Weight:      Height:        Physical Exam: Aox3, PERRL, EOMI, FS & SS, strength 5/5x4  Assessment & Plan: 57 y.o. man s/p crani for tumor biopsy, recovering well. Post-op bradycardia, rpt CTH w/ small SDH and post-op changes, not concerning, EKG worsening sinus brady / non-specific t wave changes, trops neg  -discussed with the patient and his wife, will c/s cards today -pt wants to go home, if cards clears him then I'm okay with discharge today but explained that it would depend on their opinion and, if we're seeking their guidance then we should follow it -SCDs/TEDs/SQH -diet and activity as tolerated  Judith Part  05/13/22 1:46 PM

## 2022-05-13 NOTE — Progress Notes (Unsigned)
Enrolled for [employer] to mail a ZIO XT long term holter monitor to the patients address on file.  Requested delivery date of 05/27/22 as it was requested to be applied in 2 weeks.   Dr. Stanford Breed to read.

## 2022-05-13 NOTE — Progress Notes (Signed)
Physical Therapy Treatment Patient Details Name: Sean Carrillo. MRN: [id number] DOB: 08/10/1965 Today's Date: 05/13/2022   History of Present Illness 56 yo male s/p right craniotomy biopsy taken 05/10/22 PMH HTN HLD    PT Comments    Pt much improved, may not be fully at baseline, but close.  Emphasis on age appropriate gait speeds, negotiating balance challenge with little to no deviation.  DGI 23/24.   Recommendations for follow up therapy are one component of a multi-disciplinary discharge planning process, led by the attending physician.  Recommendations may be updated based on patient status, additional functional criteria and insurance authorization.  Follow Up Recommendations  No PT follow up     Assistance Recommended at Discharge Set up Supervision/Assistance  Patient can return home with the following     Equipment Recommendations  None recommended by PT    Recommendations for Other Services       Precautions / Restrictions Precautions Precautions: Fall (minimal risk) Precaution Comments: watch HR closely as he Bradycardic, dropped to 38 and passed out     Mobility  Bed Mobility Overal bed mobility: Independent                  Transfers Overall transfer level: Independent                      Ambulation/Gait Ambulation/Gait assistance: Supervision, Independent Gait Distance (Feet): 800 Feet Assistive device: None Gait Pattern/deviations: Step-through pattern   Gait velocity interpretation: >4.37 ft/sec, indicative of normal walking speed   General Gait Details: WFL   Stairs Stairs: Yes Stairs assistance: Independent Stair Management: No rails, Alternating pattern, Forwards Number of Stairs: 10     Wheelchair Mobility    Modified Rankin (Stroke Patients Only)       Balance Overall balance assessment: Independent Sitting-balance support: No upper extremity supported Sitting balance-Leahy Scale: Good        Standing balance-Leahy Scale: Good                   Standardized Balance Assessment Standardized Balance Assessment : Dynamic Gait Index   Dynamic Gait Index Level Surface: Normal Change in Gait Speed: Normal Gait with Horizontal Head Turns: Normal Gait with Vertical Head Turns: Normal Gait and Pivot Turn: Normal Step Over Obstacle: Mild Impairment Step Around Obstacles: Normal Steps: Normal Total Score: 23      Cognition Arousal/Alertness: Awake/alert Behavior During Therapy: Flat affect Overall Cognitive Status: Within Functional Limits for tasks assessed                                          Exercises      General Comments        Pertinent Vitals/Pain Pain Assessment Pain Assessment: No/denies pain Faces Pain Scale: No hurt Pain Intervention(s): Monitored during session    Home Living                          Prior Function            PT Goals (current goals can now be found in the care plan section) Acute Rehab PT Goals PT Goal Formulation: With patient/family Time For Goal Achievement: 05/25/22 Potential to Achieve Goals: Good Progress towards PT goals: Not progressing toward goals - comment    Frequency    Min 4X/week  PT Plan Current plan remains appropriate    Co-evaluation              AM-PAC PT "6 Clicks" Mobility   Outcome Measure  Help needed turning from your back to your side while in a flat bed without using bedrails?: None Help needed moving from lying on your back to sitting on the side of a flat bed without using bedrails?: None Help needed moving to and from a bed to a chair (including a wheelchair)?: None Help needed standing up from a chair using your arms (e.g., wheelchair or bedside chair)?: None Help needed to walk in hospital room?: A Little Help needed climbing 3-5 steps with a railing? : A Little 6 Click Score: 22    End of Session   Activity Tolerance: Patient  tolerated treatment well;Patient limited by pain Patient left: in bed Nurse Communication: Mobility status PT Visit Diagnosis: Other abnormalities of gait and mobility (R26.89);Pain     Time: 1550-1606 PT Time Calculation (min) (ACUTE ONLY): 16 min  Charges:  $Gait Training: 8-22 mins                     05/13/2022  Ginger Carne., PT Acute Rehabilitation Services [phone]  (office)   Sean Carrillo 05/13/2022, 4:33 PM

## 2022-05-13 NOTE — Progress Notes (Signed)
14 day zio for bradycardia

## 2022-05-13 NOTE — Progress Notes (Signed)
Pt continues to have dizziness/lightheadedness with mobilization. HR ranging from 50-70s. Coreg held this morning (see eMAR). Pt and pt wife requesting cardiology be consulted specifically his cardiologist Dr. Stanford Breed. Dr. Zada Finders paged awaiting response.

## 2022-05-13 NOTE — Telephone Encounter (Signed)
Spoke with wife who is requesting a cardiologist see patient while he is admitted in hospital. She stated she did not discuss cardiology consult with Dr. Zada Finders; however, she would like for Dr. Stanford Breed to see patient. She is concerned that if passing out at home and in hospital is not neuro-related, she wants patient evaluated for cardiac reasons.

## 2022-05-13 NOTE — Progress Notes (Addendum)
Occupational Therapy Treatment Patient Details Name: Sean Carrillo. MRN: [id number] DOB: 09/24/1965 Today's Date: 05/13/2022   History of present illness 57 yo male s/p right craniotomy biopsy taken 05/10/22 PMH HTN HLD   OT comments  Pt has made steady progress. 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 Pt scored a 9 on the Short Blessed test, placing him in the category for impaired memory (range 5-9). Pt discussed uncertainty of biopsy and "what's next". Feel pt would benefit from outpt OT to help with strategies to compensate for difficulty with memory to increase ability to work. Discussed need for direct S with medication and financial management with wife, who verbalized understanding.  Pt may benefit from a neuropsych consult with help further assess executive level skills and help with coping strategies pending biopsy/plan of care. Pt/wife verbalized understanding.    Recommendations for follow up therapy are one component of a multi-disciplinary discharge planning process, led by the attending physician.  Recommendations may be updated based on patient status, additional functional criteria and insurance authorization.    Follow Up Recommendations  Outpatient OT     Assistance Recommended at Discharge Intermittent Supervision/Assistance  Patient can return home with the following  Direct supervision/assist for medications management;Direct supervision/assist for financial management   Equipment Recommendations       Recommendations for Other Services      Precautions / Restrictions Precautions Precautions: Fall (minimal risk) Precaution Comments: watch HR closely as he Bradycardic, dropped to 38 and passed out       Mobility Bed Mobility Overal bed mobility: Independent                   Transfers Overall transfer level: Independent                       Balance Overall balance assessment: Independent Sitting-balance support: No upper extremity supported Sitting balance-Leahy Scale: Good       Standing balance-Leahy Scale: Good                             ADL either performed or assessed with clinical judgement   ADL Overall ADL's : At baseline                                            Extremity/Trunk Assessment     Lower Extremity Assessment Lower Extremity Assessment: Overall WFL for tasks assessed        Vision   Vision Assessment?: No apparent visual deficits; no diplopia noted   Perception     Praxis      Cognition Arousal/Alertness: Awake/alert Behavior During Therapy: Flat affect Overall Cognitive Status: History of cognitive impairments - for the last 3 months - reports have worsened; scored a 9 on the short blessed test - 1 error with naming numbers in reverse order; 2 errors with months in reverse order and 1 error with delayed recall                                          Exercises  Shoulder Instructions       General Comments      Pertinent Vitals/ Pain       Pain Assessment Pain Assessment: No/denies pain Faces Pain Scale: No hurt  Home Living                                          Prior Functioning/Environment              Frequency  Min 2X/week        Progress Toward Goals  OT Goals(current goals can now be found in the care plan section)  Progress towards OT goals: Goals met/education completed, patient discharged from OT  Acute Rehab OT Goals Patient Stated Goal: to go home today Time For Goal Achievement: 05/25/22 Potential to Achieve Goals: Good ADL Goals Pt Will Perform Grooming: with modified independence;sitting Pt Will Perform Upper Body Bathing: with modified independence;sitting Pt Will Perform Lower Body  Dressing: with supervision;with adaptive equipment;sit to/from stand Pt Will Transfer to Toilet: with min guard assist;ambulating;regular height toilet Additional ADL Goal #1: Pt will complete visual scanning task with less than 2 errors  Plan Discharge plan remains appropriate    Co-evaluation                 AM-PAC OT "6 Clicks" Daily Activity     Outcome Measure   Help from another person eating meals?: None Help from another person taking care of personal grooming?: None Help from another person toileting, which includes using toliet, bedpan, or urinal?: None Help from another person bathing (including washing, rinsing, drying)?: None Help from another person to put on and taking off regular upper body clothing?: None Help from another person to put on and taking off regular lower body clothing?: None 6 Click Score: 24    End of Session    OT Visit Diagnosis: Other symptoms and signs involving cognitive function   Activity Tolerance Patient tolerated treatment well   Patient Left in bed;with call bell/phone within reach;with family/visitor present   Nurse Communication Mobility status        Time: 2683-4196 OT Time Calculation (min): 17 min  Charges: OT General Charges $OT Visit: 1 Visit OT Treatments $Therapeutic Activity: 8-22 mins  Maurie Boettcher, OT/L   Acute OT Clinical Specialist Acute Rehabilitation Services Pager [phone] Office [phone]   [name] 05/13/2022, 5:09 PM

## 2022-05-13 NOTE — Consult Note (Addendum)
Cardiology Consultation   Patient ID: Sean Carrillo. MRN: [id number]; DOB: 19-Jul-1965  Admit date: 05/10/2022 Date of Consult: 05/13/2022  PCP:  Trey Sailors, [facility] Cardiologist:  Kirk Ruths, MD   Patient Profile:   Sean Silvestro. is a 57 y.o. male with a hx of palpitations, HTN, sinus bradycardia, and brain mass who is being seen 05/13/2022 for the evaluation of syncope and bradycardia at the request of Dr. Zada Finders.  History of Present Illness:   Mr. Kyllonen established with cardiology in 2021 for evaluation of palpitations. Heart monitor at that time showed sinus rhythm and no significant arrhythmias, AV block, or pauses. Echo 2021 with normal BiV functio and no significant valvular disease. Coreg started and helped with palpitations. He was seen back after a syncopal episode in 2022. Repeat echo 2022 stable. Head CTA with irregular density in the parasellar region and prepontine cistern. 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 He was seen by Jory Sims NP 04/23/22 for preoperative risk evaluation prior to craniotomy with biopsy. He was given PRN propranolol in addition to coreg for palpitations. No further testing was recommended and he presented for craniotomy on 05/10/22. Following surgery, HR on telemetry noted in the 30s. Cardiology was consulted for symptomatic bradycardia.   Telemetry reviewed and shows sinus rhythm with brief conversion to a junctional rhythm HR 35 with recovery to sinus-sinus bradycardia. This occurred at approximately 0900. Its unclear if he was sleeping this time. Pt was continued on coreg. He has ambulated with increase in heart rate. However, one PT session summarized walking with the patient and then sitting in a chair. He started coughing and HR dropped to 38 bpm and patient reportedly lost consciousness. Head CT with stable post-surgical changes, reviewed by  Dr. Zada Finders. EKG with sinus bradycardia HR 58  He does have a history of sinus bradycardia on prior EKG tracings (2022). PTA on coreg and PRN propranolol.    Past Medical History:  Diagnosis Date   Brain tumor ([place])    Hyperlipidemia    Hypertension    Paralysis ([place])     Past Surgical History:  Procedure Laterality Date   Arthroscopic knee surgery     CHOLECYSTECTOMY       Home Medications:  Prior to Admission medications   Medication Sig Start Date End Date Taking? Authorizing Provider  ASA-APAP-Caff Buffered (VANQUISH PO) Take 2 tablets by mouth 2 (two) times daily as needed (headaches).   Yes [provider]  carvedilol (COREG) 6.25 MG tablet Take 1 tablet (6.25 mg total) by mouth 2 (two) times daily. 04/23/22  Yes Lendon Colonel, NP  propranolol (INDERAL) 10 MG tablet Take 1 tablet (10 mg total) by mouth 3 (three) times daily. Patient taking differently: Take 10 mg by mouth 3 (three) times daily as needed (afib). 04/02/22  Yes Lendon Colonel, NP  traZODone (DESYREL) 50 MG tablet Take 1 tablet (50 mg total) by mouth at bedtime as needed for sleep. Patient taking differently: Take 50 mg by mouth at bedtime. 01/29/21  Yes Dohmeier, Asencion Partridge, MD  cyclobenzaprine (FLEXERIL) 10 MG tablet Take 1 tablet (10 mg total) by mouth 2 (two) times daily as needed for muscle spasms. Patient not taking: Reported on 04/23/2022 07/13/21   Malvin Johns, MD  divalproex (DEPAKOTE) 500 MG DR tablet Take 1 tablet (500 mg total) by mouth 2 (two) times daily. Patient not taking: Reported on  04/23/2022 12/23/21   Ventura Sellers, MD  lidocaine (LIDODERM) 5 % Place 1 patch onto the skin daily. Remove & Discard patch within 12 hours or as directed by MD Patient not taking: Reported on 04/23/2022 07/13/21   Malvin Johns, MD    Inpatient Medications: Scheduled Meds:  carvedilol  6.25 mg Oral BID   docusate sodium  100 mg Oral BID   heparin injection (subcutaneous)  5,000 Units  Subcutaneous Q8H   Continuous Infusions:  sodium chloride 50 mL/hr at 05/12/22 1400   PRN Meds: acetaminophen **OR** acetaminophen, HYDROmorphone (DILAUDID) injection, labetalol, ondansetron **OR** ondansetron (ZOFRAN) IV, oxyCODONE-acetaminophen, polyethylene glycol, promethazine  Allergies:   No Known Allergies  Social History:   Social History   Socioeconomic History   Marital status: Married    Spouse name: [name]   Number of children: 2   Years of education: Not on file   Highest education level: Some college, no degree  Occupational History   Not on file  Tobacco Use   Smoking status: Never   Smokeless tobacco: Never  Vaping Use   Vaping Use: Never used  Substance and Sexual Activity   Alcohol use: Never   Drug use: Never   Sexual activity: Yes  Other Topics Concern   Not on file  Social History Narrative   Lives with wife   Caffeine- rare   Social Determinants of Health   Financial Resource Strain: Not on file  Food Insecurity: Not on file  Transportation Needs: Not on file  Physical Activity: Not on file  Stress: Not on file  Social Connections: Not on file  Intimate Partner Violence: Not on file    Family History:    Family History  Problem Relation Age of Onset   Hypertension Mother    Heart attack Father        Age 27     ROS:  Please see the history of present illness.   All other ROS reviewed and negative.     Physical Exam/Data:   Vitals:   05/12/22 2307 05/13/22 0309 05/13/22 0811 05/13/22 1159  BP: (!) 149/96  (!) 170/82 (!) 152/87  Pulse:   (!) 59 (!) 57  Resp:  '20 16 18  '$ Temp: 98.9 F (37.2 C) 99 F (37.2 C) 99.2 F (37.3 C) 98.9 F (37.2 C)  TempSrc: Oral Oral Oral Oral  SpO2:   94% 94%  Weight:      Height:        Intake/Output Summary (Last 24 hours) at 05/13/2022 1459 Last data filed at 05/13/2022 0900 Gross per 24 hour  Intake 240 ml  Output 150 ml  Net 90 ml      05/10/2022    5:54 AM 04/28/2022    2:08 PM  04/23/2022   10:26 AM  Last 3 Weights  Weight (lbs) 230 lb 229 lb 230 lb  Weight (kg) 104.327 kg 103.874 kg 104.327 kg     Body mass index is 33.97 kg/m.  General:  Well nourished, well developed, in no acute distress HEENT: normal Vascular: No carotid bruits; Distal pulses 2+ bilaterally Cardiac:  normal S1, S2; RRR; no murmur  Lungs:  clear to auscultation bilaterally, no wheezing, rhonchi or rales  Abd: soft, nontender, no hepatomegaly  Ext: no edema Musculoskeletal:  No deformities, BUE and BLE strength normal and equal Skin: warm and dry d Psych:  Normal affect   EKG:  The EKG was personally reviewed and demonstrates:  sinus bradycardia with HR 58  Telemetry:  Telemetry was personally reviewed and demonstrates:  sinus rhythm to sinus bradycardia, brief episodes of junctional rhythm in the 30s  Relevant CV Studies:  Echo 01/2021: 1. Left ventricular ejection fraction, by estimation, is 60 to 65%. The  left ventricle has normal function. The left ventricle has no regional  wall motion abnormalities. There is mild concentric left ventricular  hypertrophy. Left ventricular diastolic  parameters were normal.   2. Right ventricular systolic function is normal. The right ventricular  size is normal. Tricuspid regurgitation signal is inadequate for assessing  PA pressure.   3. The mitral valve is normal in structure. Trivial mitral valve  regurgitation. No evidence of mitral stenosis.   4. The aortic valve is normal in structure. Aortic valve regurgitation is  not visualized. No aortic stenosis is present.   Laboratory Data:  High Sensitivity Troponin:   Recent Labs  Lab 05/11/22 1545 05/11/22 1744  TROPONINIHS 5 6     Chemistry Recent Labs  Lab 05/10/22 1059 05/10/22 1620  NA 144  --   K 3.6  --   CREATININE  --  0.99  GFRNONAA  --  >60    No results for input(s): "PROT", "ALBUMIN", "AST", "ALT", "ALKPHOS", "BILITOT" in the last 168 hours. Lipids No results for  input(s): "CHOL", "TRIG", "HDL", "LABVLDL", "LDLCALC", "CHOLHDL" in the last 168 hours.  Hematology Recent Labs  Lab 05/10/22 1059 05/10/22 1620  WBC  --  9.3  RBC  --  4.60  HGB 12.9* 13.5  HCT 38.0* 41.6  MCV  --  90.4  MCH  --  29.3  MCHC  --  32.5  RDW  --  12.5  PLT  --  331   Thyroid No results for input(s): "TSH", "FREET4" in the last 168 hours.  BNPNo results for input(s): "BNP", "PROBNP" in the last 168 hours.  DDimer No results for input(s): "DDIMER" in the last 168 hours.   Radiology/Studies:  CT HEAD WO CONTRAST (5MM)  Result Date: 05/11/2022 CLINICAL DATA:  Postop craniotomy EXAM: CT HEAD WITHOUT CONTRAST TECHNIQUE: Contiguous axial images were obtained from the base of the skull through the vertex without intravenous contrast. RADIATION DOSE REDUCTION: This exam was performed according to the departmental dose-optimization program which includes automated exposure control, adjustment of the mA and/or kV according to patient size and/or use of iterative reconstruction technique. COMPARISON:  MRI head with contrast 12/06/2021 FINDINGS: Brain: Lobular extra-axial soft tissue mass involving the basilar cisterns appears similar to the prior MRI. These are mildly hyperdense to brain. Postop biopsy with extra-axial gas above the right sphenoid bone. Left hemispheric subdural hematoma is high density measures approximately 3 mm in thickness. Mild high-density subdural hematoma along the posterior falx and bilateral tentorium. Ventricle size normal. Chronic infarct left lenticular nuclei. No acute infarct. No midline shift. Vascular: Negative for hyperdense vessel Skull: Postop right temporal craniotomy. Small amount of subdural gas on the right. Gas and fluid in the scalp. Sinuses/Orbits: Paranasal sinuses clear. Mastoid clear. Negative orbit Other: None IMPRESSION: 1. Postop right temporal craniotomy for biopsy. 2. 3 mm left hemispheric subdural hematoma. Mild subdural hematoma along  the posterior falx and bilateral tentorium. 3. Lobular extra-axial soft tissue mass in the basilar cisterns appears similar to the prior MRI. 4. These results were called by telephone at the time of interpretation on 05/11/2022 at 5:12 pm to provider [place]ard , who verbally acknowledged these results. Electronically Signed   By: Franchot Gallo M.D.   On: 05/11/2022  17:13     Assessment and Plan:   Syncope Pt reportedly had an episode of bradycardia in the 30s and LOC while seated in a chair.  - question vagal event with coughing and pain from recent surgery - note one episode of junctional bradycardia but this was not at the time of the PT incident Bradycardia  - pt has reported dizziness and lightheadedness with mobilization - hx of sinus bradycardia - brief bouts of junctional bradycardia - hold coreg and propranolol - will place a heart monitor in 2 weeks Palpitations Heart monitor and echo previously unrevealing Repeat heart monitor as above Hold BB Hypertension Transition to non-AV nodal agent, suggest amlodipine if BP control needed.  - uncontrolled here in the 150-170s  Risk Assessment/Risk Scores:   For questions or updates, please contact [facility] Please consult [email or web address] for contact info under   Signed, Ledora Bottcher, PA  05/13/2022 2:59 PM As above, patient seen and examined.  Briefly he is a 57 year old male with past medical history of hypertension, palpitations and brain mass now status postcraniotomy with resection of brain mass for evaluation of bradycardia and syncope.  Most recent echocardiogram September 2022 showed normal LV function, mild left ventricular hypertrophy.  Patient has a long history of palpitations.  In the past 1 year he has had episodes where he is vision changes.  He will perceive things at a different distance and feel like he might pass out.  This can be associated with palpitations.  However he cannot palpitations other times  without these symptoms.  He was admitted for brain mass and underwent craniotomy with resection.  During the hospitalization he has been noted to be bradycardic intermittently.  His wife states he got out of bed 2 days ago and was taken to the chair.  He then began coughing and had frank syncope.  He otherwise denies chest pain, dyspnea.  Telemetry reviewed and he had transient junctional rhythm with heart rate in the 30s that quickly resolved.  Electrocardiogram shows sinus bradycardia rate of 58 and nonspecific ST changes.  Potassium 4.6, troponin is normal.  Hemoglobin 13.5.  1 syncope/bradycardia-patient has had intermittent bradycardia predominately when he is sleeping.  He had an episode of syncope after coughing and telemetry showed junctional rhythm with heart rate in the 30s.  Likely vagal in etiology.  Note previous echocardiogram showed normal LV function.  No further therapy indicated at this point.  Note patient previously had an outpatient sleep study that was minute.  Will see if we can arrange inpatient study at follow-up.  2 palpitations-he has had a long history of palpitations.  We will arrange an outpatient 2-week Zio patch.  3 hypertension-patient's blood pressure is running high.  He does have some bradycardia at baseline.  Will discontinue carvedilol and treat with amlodipine 5 mg daily.  Follow blood pressure and adjust as needed.  4 status post resection of brain mass-pathology is pending.  Per neurosurgery.  Patient can be discharged from a cardiac standpoint with plan as outlined above.  Please call with questions.  Kirk Ruths, MD

## 2022-05-13 NOTE — Discharge Summary (Signed)
Discharge Summary  Date of Admission: 05/10/2022  Date of Discharge: 05/13/22  Attending Physician: Emelda Brothers, MD  Hospital Course: Patient was admitted following an uncomplicated right sided craniotomy for biopsy and 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 They were recovered in PACU and transferred to 4N ICU. Hisf hospital course was only notable for some post-operative bradycardia. Cardiac workup was unremarkable except for some non-specific T-wave changes that were thought to be related to post-craniotomy changes, cardiology was consulted and recommended outpatient follow up for placement of a zio patch. A post-operative CTH showed post-op changes with a small associated subdural. PT/OT recommended no follow up. The admission was otherwise uncomplicated and the patient was discharged home. They will follow up in clinic with me in clinic in 2 weeks.  Neurologic exam at discharge:  Aox3, PERRL, EOMI, FS & SS, strength 5/5, speech fluent with normal content  Discharge diagnosis: Skull base tumor  Judith Part, MD 05/13/22 5:32 PM

## 2022-05-13 NOTE — Progress Notes (Signed)
Pt with orders to d/c home. PIV removed. Assessment and VS documented pt is stable. Discharge packet and education provided to pt all questions answered. Prescriptions sent to pharmacy pt is aware. Pt transported via wheelchair to private vehicle.

## 2022-05-13 NOTE — TOC Progression Note (Signed)
Transition of Care (TOC) - Progression Note    Patient Details  Name: Sean Carrillo. MRN: [id number] Date of Birth: 24-Jul-1965  [facility]) CM/SW Contact  Levonne Lapping, RN Phone Number: 05/13/2022, 11:44 AM  Clinical Narrative:     CM met with patient and patient's Sister, bedside. Discussed recommendations for OPPT and OPOT. Patient is agreeable and would like to be referred to the [facility] location. Referral has been made and information entered into AVS for patient reference. Patient will dc home with Wife who will transport. No DME has been recommended.   TOC will continue to follow patient for any additional discharge needs            Expected Discharge Plan and Services                                               Social Determinants of Health (SDOH) Interventions SDOH Screenings   Tobacco Use: Low Risk  (05/10/2022)    Readmission Risk Interventions     No data to display

## 2022-05-13 NOTE — Telephone Encounter (Signed)
Patient's wife was calling back due to stating she had not heard back. I advised the RN did callback yesterday leaving a VM. She states was unaware of a callback due to the wrong number being listed for the call to be returned. Apologized for the inconvenience and advised I would have the correct number called back. Patient is currently admitted and they are wanting Dr. Stanford Breed to see the patient before he is discharged. Advised pt's wife the hospital would need to contact/page him since he is rounding and she reports she was advised by the hospital nurse to call our office because they are unable to page [facility] since the pt does not have a cardiology consult setup. Patient's wife did reconfirm this information with the hospital nurse while I was on the phone so she is wanting to know what to do so Sean Carrillo sees cardiology before leaving. Please advise.

## 2022-05-14 NOTE — Telephone Encounter (Signed)
Patient has been seen.

## 2022-05-24 NOTE — Therapy (Unsigned)
OUTPATIENT PHYSICAL THERAPY NEURO EVALUATION   Patient Name: Sean Carrillo. MRN: [id number] DOB:07-14-1965, 57 y.o., male Today's Date: 05/26/2022   PCP: Raelyn Number REFERRING PROVIDER: Emelda Brothers  END OF SESSION:  PT End of Session - 05/25/22 1532     Visit Number 1    Number of Visits 16    Date for PT Re-Evaluation 07/20/22    Authorization Type BCBS    PT Start Time 1530    PT Stop Time 1615    PT Time Calculation (min) 45 min    Activity Tolerance Patient tolerated treatment well;Patient limited by pain             Past Medical History:  Diagnosis Date   Brain tumor ([place])    Hyperlipidemia    Hypertension    Paralysis ([place])    Past Surgical History:  Procedure Laterality Date   APPLICATION OF CRANIAL NAVIGATION N/A 05/10/2022   Procedure: APPLICATION OF CRANIAL NAVIGATION;  Surgeon: Judith Part, MD;  Location: [place];  Service: Neurosurgery;  Laterality: N/A;   Arthroscopic knee surgery     CHOLECYSTECTOMY     CRANIOTOMY Right 05/10/2022   Procedure: Right craniotomy for tumor resection;  Surgeon: Judith Part, MD;  Location: [place];  Service: Neurosurgery;  Laterality: Right;  RM 21   Patient Active Problem List   Diagnosis Date Noted   Bradycardia 05/13/2022   Brain tumor ([place]) 05/10/2022   Brain mass 03/09/2021   PVFS (postviral fatigue syndrome) 02/10/2021   Syncope 02/10/2021   Chronic insomnia 02/10/2021   Palpitations 04/10/2020   Essential hypertension 04/10/2020   Tear of lateral cartilage or meniscus of knee, current 12/16/2011    ONSET DATE: ~2 weeks ago  REFERRING DIAG: G93.89 (ICD-10-CM) - Brain mass   THERAPY DIAG:  Muscle weakness (generalized)  Unsteadiness on feet  Other abnormalities of gait and mobility  Rationale for Evaluation and Treatment: Rehabilitation  SUBJECTIVE:                                                                                                                                                                                              SUBJECTIVE STATEMENT: Brain surgery ~2 weeks ago. Pain has been bad since. Has tried pain medicine but has not had a good response. Pt notes increased weakness. Balance has been off as noted by wife. Wife reports pt has not been eating as much. Pt notes R eye is not the best right now. Pt reports difficulty thinking/remembering -- worried as this may affect his work. Pt is to follow up tomorrow with neurosurgeon. Notes decreased sensation  in L arm. L leg feels like rubber.  Pt accompanied by: significant other  PERTINENT HISTORY: Patient was admitted 1/8 to 05/13/22 following an uncomplicated right sided craniotomy for biopsy and partial resection of a midline skull base mass   PAIN:  Are you having pain? Yes: NPRS scale: 8/10 Pain location: Front temple into behind R ear Pain description: Throbbing Aggravating factors: light Relieving factors: nothing thus far  PRECAUTIONS: None  WEIGHT BEARING RESTRICTIONS: No  FALLS: Has patient fallen in last 6 months? 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 Postop right temporal craniotomy for biopsy. 2. 3 mm left hemispheric subdural hematoma. Mild subdural hematoma along the posterior falx and bilateral tentorium. 3. Lobular extra-axial soft tissue mass in the basilar cisterns appears similar to the prior MRI. 4. These results were called by telephone at the time of interpretation on 05/11/2022 at 5:12 pm to provider Ostergard , who verbally acknowledged these  results.  COGNITION: Overall cognitive status:  reports impaired memory and cognition   SENSATION: WFL "leg feels like rubber"  COORDINATION: Slow with all movements  EDEMA:  None  MUSCLE TONE: None noted  MUSCLE LENGTH: Did not assess  DTRs: Did not assess  POSTURE: Holds head in flexed position, rounded shoulders/thoracic spine due to pain/fatigue  CERVICAL ROM:   Active ROM A/PROM (deg) eval  Flexion 60  Extension 20*  Right lateral flexion 40  Left lateral flexion 30  Right rotation 40  Left rotation 45   (Blank rows = not tested)  * = pain Feels like head drops to the right  LOWER EXTREMITY ROM:   Grossly WFL  LOWER EXTREMITY MMT:    MMT Right Eval Left Eval  Hip flexion 3+ 4  Hip extension 4- 4-  Hip abduction 3+ 4-  Hip adduction    Hip internal rotation    Hip external rotation    Knee flexion 4 4  Knee extension 4 4  Ankle dorsiflexion    Ankle plantarflexion    Ankle inversion    Ankle eversion    (Blank rows = not tested)  UPPER EXTREMITY MMT: grossly 3+/5 bilat  BED MOBILITY: Independent but slow  TRANSFERS: Assistive device utilized: None  Sit to stand: Complete Independence Stand to sit: Complete Independence Chair to chair: Complete Independence Floor:  did not assess   STAIRS: fatigued after strength and balance testing to assess  GAIT: Gait pattern: step through pattern, shuffling, lateral lean- Right, and lateral lean- Left Distance walked: to back of clinic Assistive device utilized: None Level of assistance: Complete Independence Comments: n/a  FUNCTIONAL TESTS:  5 times sit to stand: 22.95 sec Berg Balance Scale: 47/56 Dynamic Gait Index: TBA  [facility] PT Assessment - 05/26/22 0001       Standardized Balance Assessment   Standardized Balance Assessment Berg Balance Test      Berg Balance Test   Sit to Stand Able to stand  independently using hands    Standing Unsupported Able to stand safely 2 minutes     Sitting with Back Unsupported but Feet Supported on Floor or  Stool Able to sit safely and securely 2 minutes    Stand to Sit Sits safely with minimal use of hands    Transfers Able to transfer safely, minor use of hands    Standing Unsupported with Eyes Closed Able to stand 10 seconds safely    Standing Unsupported with Feet Together Able to place feet together independently and stand for 1 minute with supervision    From Standing, Reach Forward with Outstretched Arm Can reach forward >12 cm safely (5")    From Standing Position, Pick up Object from [place] to pick up shoe safely and easily    From Standing Position, Turn to Look Behind Over each Shoulder Looks behind one side only/other side shows less weight shift    Turn 360 Degrees Able to turn 360 degrees safely but slowly    Standing Unsupported, Alternately Place Feet on Step/Stool Able to stand independently and complete 8 steps >20 seconds    Standing Unsupported, One Foot in Front Able to plae foot ahead of the other independently and hold 30 seconds    Standing on One Leg Able to lift leg independently and hold 5-10 seconds   6 sec L, 10 sec R   Total Score 47              PATIENT SURVEYS:  FOTO not appropriate  TODAY'S TREATMENT:                                                                                                                              DATE: 05/25/22 Supine Chin tuck x10 Scap squeeze x10 Shoulder flexion AAROM with dowel x10 Bridge x10 Clamshell x10     PATIENT EDUCATION: Education details: Exam findings, POC, initial HEP, obtaining SLP Person educated: Patient Education method: Explanation, Demonstration, and Handouts Education comprehension: verbalized understanding, returned demonstration, and needs further education  HOME EXERCISE PROGRAM: Access Code: [place] URL: [email or web address] Date: 05/25/2022 Prepared by: Estill Bamberg April Thurnell Garbe  Exercises - Supine Shoulder  Flexion Extension AAROM with Dowel  - 1 x daily - 7 x weekly - 2 sets - 10 reps - Supine Scapular Retraction  - 1 x daily - 7 x weekly - 2 sets - 10 reps - 3 sec hold - Supine Chin Tuck  - 1 x daily - 7 x weekly - 2 sets - 10 reps - 3 sec hold - Supine Bridge  - 1 x daily - 7 x weekly - 2 sets - 10 reps - Clamshell  - 1 x daily - 7 x weekly - 2 sets - 10 reps   GOALS: Goals reviewed with patient? Yes  SHORT TERM GOALS: Target date: 06/23/2022   Pt will be ind with initial HEP Baseline: Goal status: INITIAL  2.  Pt will demo improved 5x STS to </=15 sec for decreased fall risk Baseline:  Goal status: INITIAL  3.  Pt will demo improved cervical ROM for lateral flexion and rotation  by >/=10 deg Baseline:  Goal status: INITIAL   LONG TERM GOALS: Target date: 07/21/2022   Pt will be ind with progression and advancement of HEP Baseline:  Goal status: INITIAL  2.  Pt will have improved Berg Balance Score to >/=52/56 for decreased fall risk Baseline:  Goal status: INITIAL  3.  Pt will improve 5x STS to </=13 sec to decrease fall risk Baseline:  Goal status: INITIAL  4.  Pt will demo enough endurance to get through a full setlist of songs for return to work Baseline:  Goal status: INITIAL  5.  Pt will report improved headache by >/=50% Baseline:  Goal status: INITIAL   ASSESSMENT:  CLINICAL IMPRESSION: Patient is a 57 y.o. M who was seen today for physical therapy evaluation and treatment post op R craniotomy/biopsy on 05/10/22. Pt demos increased fatigue, general weakness, and decreased balance affecting home and work tasks. Pt would benefit from PT to address these issues to return to PLOF. Discussed with pt and wife about obtaining SLP referral to address pt's cognitive impairments. Limited due to fatigue and headache pain.  OBJECTIVE IMPAIRMENTS: Abnormal gait, decreased activity tolerance, decreased balance, decreased endurance, decreased mobility, difficulty walking,  decreased ROM, decreased strength, impaired sensation, impaired UE functional use, improper body mechanics, postural dysfunction, and pain.   ACTIVITY LIMITATIONS: bending, standing, squatting, locomotion level, and caring for others  PARTICIPATION LIMITATIONS: cleaning, laundry, driving, shopping, community activity, occupation, and yard work  PERSONAL FACTORS: Past/current experiences and Time since onset of injury/illness/exacerbation are also affecting patient's functional outcome.   REHAB POTENTIAL: Good  CLINICAL DECISION MAKING: Evolving/moderate complexity  EVALUATION COMPLEXITY: Moderate  PLAN:  PT FREQUENCY: 2x/week  PT DURATION: 8 weeks  PLANNED INTERVENTIONS: Therapeutic exercises, Therapeutic activity, Neuromuscular re-education, Balance training, Gait training, Patient/Family education, Self Care, Joint mobilization, Stair training, Vestibular training, Aquatic Therapy, Dry Needling, Electrical stimulation, Spinal mobilization, Cryotherapy, Moist heat, Taping, Traction, Ionotophoresis '4mg'$ /ml Dexamethasone, Manual therapy, and Re-evaluation  PLAN FOR NEXT SESSION: Assess DGI. Assess response to HEP -- continue to work on strength/endurance/balance. How was doctor visit?   Shuayb Schepers April Ma L Ahnika Hannibal, PT 05/26/2022, 9:43 AM

## 2022-05-25 ENCOUNTER — Ambulatory Visit: Payer: BC Managed Care – PPO | Attending: Neurological Surgery | Admitting: Physical Therapy

## 2022-05-25 ENCOUNTER — Encounter: Payer: Self-pay | Admitting: Physical Therapy

## 2022-05-25 ENCOUNTER — Other Ambulatory Visit: Payer: Self-pay

## 2022-05-25 DIAGNOSIS — R41841 Cognitive communication deficit: Secondary | ICD-10-CM | POA: Diagnosis not present

## 2022-05-25 DIAGNOSIS — R2689 Other abnormalities of gait and mobility: Secondary | ICD-10-CM | POA: Diagnosis not present

## 2022-05-25 DIAGNOSIS — M6281 Muscle weakness (generalized): Secondary | ICD-10-CM | POA: Insufficient documentation

## 2022-05-25 DIAGNOSIS — G9389 Other specified disorders of brain: Secondary | ICD-10-CM | POA: Diagnosis not present

## 2022-05-25 DIAGNOSIS — R2681 Unsteadiness on feet: Secondary | ICD-10-CM | POA: Diagnosis not present

## 2022-05-27 ENCOUNTER — Ambulatory Visit: Payer: BC Managed Care – PPO

## 2022-05-27 DIAGNOSIS — R001 Bradycardia, unspecified: Secondary | ICD-10-CM | POA: Diagnosis not present

## 2022-05-27 DIAGNOSIS — G9389 Other specified disorders of brain: Secondary | ICD-10-CM | POA: Diagnosis not present

## 2022-05-27 DIAGNOSIS — R2681 Unsteadiness on feet: Secondary | ICD-10-CM

## 2022-05-27 DIAGNOSIS — R41841 Cognitive communication deficit: Secondary | ICD-10-CM | POA: Diagnosis not present

## 2022-05-27 DIAGNOSIS — M6281 Muscle weakness (generalized): Secondary | ICD-10-CM | POA: Diagnosis not present

## 2022-05-27 DIAGNOSIS — R2689 Other abnormalities of gait and mobility: Secondary | ICD-10-CM

## 2022-05-27 NOTE — Therapy (Signed)
OUTPATIENT PHYSICAL THERAPY TREATMENT   Patient Name: Sean Carrillo. MRN: [id number] DOB:Jan 22, 1966, 57 y.o., male Today's Date: 05/27/2022   PCP: Raelyn Number REFERRING PROVIDER: Emelda Brothers  END OF SESSION:  PT End of Session - 05/27/22 1533     Visit Number 2    Number of Visits 16    Date for PT Re-Evaluation 07/20/22    Authorization Type BCBS    PT Start Time 1534    PT Stop Time 1618    PT Time Calculation (min) 44 min             Past Medical History:  Diagnosis Date   Brain tumor ([place])    Hyperlipidemia    Hypertension    Paralysis ([place])    Past Surgical History:  Procedure Laterality Date   APPLICATION OF CRANIAL NAVIGATION N/A 05/10/2022   Procedure: APPLICATION OF CRANIAL NAVIGATION;  Surgeon: Judith Part, MD;  Location: [place];  Service: Neurosurgery;  Laterality: N/A;   Arthroscopic knee surgery     CHOLECYSTECTOMY     CRANIOTOMY Right 05/10/2022   Procedure: Right craniotomy for tumor resection;  Surgeon: Judith Part, MD;  Location: [place];  Service: Neurosurgery;  Laterality: Right;  RM 21   Patient Active Problem List   Diagnosis Date Noted   Bradycardia 05/13/2022   Brain tumor ([place]) 05/10/2022   Brain mass 03/09/2021   PVFS (postviral fatigue syndrome) 02/10/2021   Syncope 02/10/2021   Chronic insomnia 02/10/2021   Palpitations 04/10/2020   Essential hypertension 04/10/2020   Tear of lateral cartilage or meniscus of knee, current 12/16/2011    ONSET DATE: ~2 weeks ago  REFERRING DIAG: G93.89 (ICD-10-CM) - Brain mass   THERAPY DIAG:  Muscle weakness (generalized)  Unsteadiness on feet  Other abnormalities of gait and mobility  Rationale for Evaluation and Treatment: Rehabilitation  SUBJECTIVE:                                                                                                                                                                                             SUBJECTIVE  STATEMENT: Patient reports he is not sleeping well, states he woke up to his head "pounding" and has been taking over the counter pain medication. Patient states his leg feels less "rubbery". Patient states he received heart monitor today; states he has appointment with SLP tomorrow 05/28/22.  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 Yes: NPRS scale: 0-3/10 Pain location: Front temple  into behind R ear Pain description: Throbbing Aggravating factors: light Relieving factors: nothing thus far  PRECAUTIONS: None  WEIGHT BEARING RESTRICTIONS: No  FALLS: Has patient fallen in last 6 months? 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 Postop right temporal craniotomy for biopsy. 2. 3 mm left hemispheric subdural hematoma. Mild subdural hematoma along the posterior falx and bilateral tentorium. 3. Lobular extra-axial soft tissue mass in the basilar cisterns appears similar to the prior MRI. 4. These results were called by telephone at the time of interpretation on 05/11/2022 at 5:12 pm to provider Ostergard , who verbally acknowledged these results.  COGNITION: Overall cognitive status:  reports impaired memory and cognition   SENSATION: WFL "leg feels like rubber"  COORDINATION: Slow with all movements  EDEMA:  None  MUSCLE TONE: None noted  MUSCLE LENGTH: Did not assess  DTRs: Did not assess  POSTURE: Holds head  in flexed position, rounded shoulders/thoracic spine due to pain/fatigue  CERVICAL ROM:   Active ROM A/PROM (deg) eval  Flexion 60  Extension 20*  Right lateral flexion 40  Left lateral flexion 30  Right rotation 40  Left rotation 45   (Blank rows = not tested)  * = pain Feels like head drops to the right  LOWER EXTREMITY ROM:   Grossly WFL  LOWER EXTREMITY MMT:    MMT Right Eval Left Eval  Hip flexion 3+ 4  Hip extension 4- 4-  Hip abduction 3+ 4-  Hip adduction    Hip internal rotation    Hip external rotation    Knee flexion 4 4  Knee extension 4 4  Ankle dorsiflexion    Ankle plantarflexion    Ankle inversion    Ankle eversion    (Blank rows = not tested)  UPPER EXTREMITY MMT: grossly 3+/5 bilat  BED MOBILITY: Independent but slow  TRANSFERS: Assistive device utilized: None  Sit to stand: Complete Independence Stand to sit: Complete Independence Chair to chair: Complete Independence Floor:  did not assess   STAIRS: fatigued after strength and balance testing to assess  GAIT: Gait pattern: step through pattern, shuffling, lateral lean- Right, and lateral lean- Left Distance walked: to back of clinic Assistive device utilized: None Level of assistance: Complete Independence Comments: n/a  FUNCTIONAL TESTS:  5 times sit to stand: 22.95 sec Berg Balance Scale: 47/56 Dynamic Gait Index: TBA 1/25: [place] 13/24    PATIENT SURVEYS:  FOTO not appropriate  TODAY'S TREATMENT:    [facility] Adult PT Treatment:                                                DATE: 05/27/2022 Therapeutic Exercise: Supine, 3#dowel: Straight arm punch up Shoulder flexion Shoulder horizontal abd RTB Chin tucks  Seated: Bent arm shoulder ER x10 --> YTB 2x10 STS x10 cradling 6#MB +  naming song titles  Therapeutic Activity: DHI Dynamic weight shifting: Colored dot call out --> clock toe tap call out  DATE: 05/25/22 Supine Chin tuck x10 Scap squeeze x10 Shoulder flexion AAROM with dowel x10 Bridge x10 Clamshell x10     PATIENT EDUCATION: Education details: Dual tasking Person educated: Patient Education method: Explanation, Demonstration, and Handouts Education comprehension: verbalized understanding, returned demonstration, and needs further education  HOME EXERCISE PROGRAM: Access Code: [place] URL: [email or web address] Date: 05/25/2022 Prepared by: Estill Bamberg April Thurnell Garbe  Exercises - Supine Shoulder Flexion Extension AAROM with Dowel  - 1 x daily - 7 x weekly - 2 sets - 10 reps - Supine Scapular Retraction  - 1 x daily - 7 x weekly - 2 sets - 10 reps - 3 sec hold - Supine Chin Tuck  - 1 x daily - 7 x weekly - 2 sets - 10 reps - 3 sec hold - Supine Bridge  - 1 x daily - 7 x weekly - 2 sets - 10 reps - Clamshell  - 1 x daily - 7 x weekly - 2 sets - 10 reps   GOALS: Goals reviewed with patient? Yes  SHORT TERM GOALS: Target date: 06/23/2022   Pt will be ind with initial HEP Baseline: Goal status: INITIAL  2.  Pt will demo improved 5x STS to </=15 sec for decreased fall risk Baseline:  Goal status: INITIAL  3.  Pt will demo improved cervical ROM for lateral flexion and rotation by >/=10 deg Baseline:  Goal status: INITIAL   LONG TERM GOALS: Target date: 07/21/2022   Pt will be ind with progression and advancement of HEP Baseline:  Goal status: INITIAL  2.  Pt will have improved Berg Balance Score to >/=52/56 for decreased fall risk Baseline:  Goal status: INITIAL  3.  Pt will improve 5x STS to </=13 sec to decrease fall risk Baseline:  Goal status: INITIAL  4.  Pt will demo enough endurance to get through a full setlist of songs for return to work Baseline:  Goal status: INITIAL  5.  Pt will report improved headache by >/=50% Baseline:  Goal status:  INITIAL   ASSESSMENT:  CLINICAL IMPRESSION:  Cognitive component incorporated with ambulatory and repeated sit to stand to challenge dual tasking. Moderate fatigue exhibited during repeated sit to stand with light resistance. Patient reported increase in headache pain from 0 to 3 after dual tasking interventions. Discussion with patient and patient's wife on strategies to incorporate more dual tasking and endurance training with ADLs.  OBJECTIVE IMPAIRMENTS: Abnormal gait, decreased activity tolerance, decreased balance, decreased endurance, decreased mobility, difficulty walking, decreased ROM, decreased strength, impaired sensation, impaired UE functional use, improper body mechanics, postural dysfunction, and pain.   ACTIVITY LIMITATIONS: bending, standing, squatting, locomotion level, and caring for others  PARTICIPATION LIMITATIONS: cleaning, laundry, driving, shopping, community activity, occupation, and yard work  PERSONAL FACTORS: Past/current experiences and Time since onset of injury/illness/exacerbation are also affecting patient's functional outcome.   REHAB POTENTIAL: Good  CLINICAL DECISION MAKING: Evolving/moderate complexity  EVALUATION COMPLEXITY: Moderate  PLAN:  PT FREQUENCY: 2x/week  PT DURATION: 8 weeks  PLANNED INTERVENTIONS: Therapeutic exercises, Therapeutic activity, Neuromuscular re-education, Balance training, Gait training, Patient/Family education, Self Care, Joint mobilization, Stair training, Vestibular training, Aquatic Therapy, Dry Needling, Electrical stimulation, Spinal mobilization, Cryotherapy, Moist heat, Taping, Traction, Ionotophoresis '4mg'$ /ml Dexamethasone, Manual therapy, and Re-evaluation  PLAN FOR NEXT SESSION: Dual tasking (singing); Continue to work on strength/endurance/balance.   Hardin Negus, PTA 05/27/2022, 5:25 PM

## 2022-05-28 ENCOUNTER — Ambulatory Visit: Payer: BC Managed Care – PPO

## 2022-05-28 ENCOUNTER — Other Ambulatory Visit: Payer: Self-pay

## 2022-05-28 DIAGNOSIS — R2689 Other abnormalities of gait and mobility: Secondary | ICD-10-CM | POA: Diagnosis not present

## 2022-05-28 DIAGNOSIS — R2681 Unsteadiness on feet: Secondary | ICD-10-CM | POA: Diagnosis not present

## 2022-05-28 DIAGNOSIS — R41841 Cognitive communication deficit: Secondary | ICD-10-CM

## 2022-05-28 DIAGNOSIS — G9389 Other specified disorders of brain: Secondary | ICD-10-CM | POA: Diagnosis not present

## 2022-05-28 DIAGNOSIS — M6281 Muscle weakness (generalized): Secondary | ICD-10-CM | POA: Diagnosis not present

## 2022-05-28 NOTE — Therapy (Addendum)
OUTPATIENT SPEECH LANGUAGE PATHOLOGY EVALUATION   Patient Name: Sean Carrillo. MRN: [id number] DOB:02-14-1966, 57 y.o., male Today's Date: 05/28/2022  PCP: Trey Sailors PA REFERRING PROVIDER: Judith Part, MD  END OF SESSION:  End of Session - 05/28/22 1250     Visit Number 1    Number of Visits 17    Date for SLP Re-Evaluation 07/29/22    SLP Start Time 65    SLP Stop Time  1102    SLP Time Calculation (min) 42 min    Activity Tolerance Patient tolerated treatment well             Past Medical History:  Diagnosis Date   Brain tumor ([place])    Hyperlipidemia    Hypertension    Paralysis ([place])    Past Surgical History:  Procedure Laterality Date   APPLICATION OF CRANIAL NAVIGATION N/A 05/10/2022   Procedure: APPLICATION OF CRANIAL NAVIGATION;  Surgeon: Judith Part, MD;  Location: [facility];  Service: Neurosurgery;  Laterality: N/A;   Arthroscopic knee surgery     CHOLECYSTECTOMY     CRANIOTOMY Right 05/10/2022   Procedure: Right craniotomy for tumor resection;  Surgeon: Judith Part, MD;  Location: [facility];  Service: Neurosurgery;  Laterality: Right;  RM 21   Patient Active Problem List   Diagnosis Date Noted   Bradycardia 05/13/2022   Brain tumor ([place]) 05/10/2022   Brain mass 03/09/2021   PVFS (postviral fatigue syndrome) 02/10/2021   Syncope 02/10/2021   Chronic insomnia 02/10/2021   Palpitations 04/10/2020   Essential hypertension 04/10/2020   Tear of lateral cartilage or meniscus of knee, current 12/16/2011    ONSET DATE: 05-10-22   REFERRING DIAG: D49.6 (ICD-10-CM) - Neoplasm of unspecified behavior of brain  THERAPY DIAG:  Cognitive communication deficit - Plan: SLP plan of care cert/re-cert  Rationale for Evaluation and Treatment: Rehabilitation  SUBJECTIVE:   SUBJECTIVE STATEMENT: "I can't remember songs anymore." "I'm sure I'd forget all my medications if it weren't fro my wife." Pt accompanied by:  self  PERTINENT HISTORY: Patient was admitted 1/8 to 05/13/22 following an uncomplicated right sided craniotomy for biopsy and 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 Brain surgery 2-3 weeks ago. Pain has been bad since. Has tried pain medicine but has not had a good response. Pt notes increased weakness. Balance has been off as noted by wife. Wife reports pt has not been eating as much. Pt notes R eye is not the best right now. Pt reports difficulty with recall -- worried as this may affect his work.  PAIN:  Are you having pain? Yes: NPRS scale: 3-4/10 Pain location: head Pain description: HA   FALLS: Has patient fallen in last 6 months?  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  ORAL MOTOR EXAMINATION: Overall status: WFL  STANDARDIZED ASSESSMENTS: [employer] Verbal Learning Test )HVLT) - pt scored WNL on first trial recall, below WNL on subsequent two trials (9/12, 7/12, 7/12). He was outside WNL on  Recognition (11/12).   PATIENT EDUCATION: Education details: Pt was told to watch old recordings of shows and take notes on the details and also to sing with the shows with his notes, to foster recall of details and sequence. Person educated: Patient Education method: Explanation and Handouts Education comprehension: verbalized understanding   GOALS: Goals reviewed with patient? No  SHORT TERM GOALS: Target date: 06/30/22  Pt  will bring memory system to 60% of sessions Baseline: Goal status: INITIAL  2.  Pt will demo use/report use of memory system with extended time allowed, in/between 4 sessions Baseline:  Goal status: INITIAL  3.  Pt will undergo further cognitive linguistic testing Baseline:  Goal status: INITIAL   LONG TERM GOALS: Target date: 07/29/22  Pt will score higher/better on PROM than initial administration Baseline:  Goal status: INITIAL  2.  Pt will complete medication administration tasks with modified independence over two weeks Baseline:  Goal status: INITIAL  3.  Pt will utilize memory strategies/system successfully over 3 weeks Baseline:  Goal status: INITIAL   ASSESSMENT:  CLINICAL IMPRESSION: Patient is a 57 y.o. male who was seen today for assessment of cognitive linguistics/memory. He needs recall to perform tasks both at work and at home.   OBJECTIVE IMPAIRMENTS: include attention and memory. These impairments are limiting patient from return to work, managing medications, managing appointments, managing finances, household responsibilities, ADLs/IADLs, and effectively communicating at home and in community. Factors affecting potential to achieve goals and functional outcome are co-morbidities and severity of impairments.. Patient will benefit from skilled SLP services to address above impairments and improve overall function.  REHAB POTENTIAL: Good  PLAN:  SLP FREQUENCY: 2x/week  SLP DURATION: 8 weeks  PLANNED INTERVENTIONS: Environmental controls, Cueing hierachy, Cognitive reorganization, Internal/external aids, Functional tasks, SLP instruction and feedback, Compensatory strategies, and Patient/family education    [facility], [place] 05/28/2022, 12:51 PM

## 2022-05-31 ENCOUNTER — Ambulatory Visit: Payer: BC Managed Care – PPO

## 2022-05-31 DIAGNOSIS — R2689 Other abnormalities of gait and mobility: Secondary | ICD-10-CM | POA: Diagnosis not present

## 2022-05-31 DIAGNOSIS — M6281 Muscle weakness (generalized): Secondary | ICD-10-CM

## 2022-05-31 DIAGNOSIS — R2681 Unsteadiness on feet: Secondary | ICD-10-CM

## 2022-05-31 DIAGNOSIS — R41841 Cognitive communication deficit: Secondary | ICD-10-CM

## 2022-05-31 DIAGNOSIS — G9389 Other specified disorders of brain: Secondary | ICD-10-CM | POA: Diagnosis not present

## 2022-05-31 NOTE — Therapy (Signed)
OUTPATIENT PHYSICAL THERAPY TREATMENT   Patient Name: Sean Carrillo. MRN: [id number] DOB:[birth year], 57 y.o., male Today's Date: 05/31/2022   PCP: Raelyn Number REFERRING PROVIDER: Emelda Brothers  END OF SESSION:  PT End of Session - 05/31/22 1020     Visit Number 3    Number of Visits 16    Date for PT Re-Evaluation 07/20/22    Authorization Type BC[employer]    PT Start Time 1020    PT Stop Time 1108    PT Time Calculation (min) 48 min             Past Medical History:  Diagnosis Date   Brain tumor ([place])    Hyperlipidemia    Hypertension    Paralysis ([place])    Past Surgical History:  Procedure Laterality Date   APPLICATION OF CRANIAL NAVIGATION N/A 05/10/2022   Procedure: APPLICATION OF CRANIAL NAVIGATION;  Surgeon: Judith Part, MD;  Location: [facility];  Service: Neurosurgery;  Laterality: N/A;   Arthroscopic knee surgery     CHOLECYSTECTOMY     CRANIOTOMY Right 05/10/2022   Procedure: Right craniotomy for tumor resection;  Surgeon: Judith Part, MD;  Location: [place];  Service: Neurosurgery;  Laterality: Right;  RM 21   Patient Active Problem List   Diagnosis Date Noted   Bradycardia 05/13/2022   Brain tumor ([place]) 05/10/2022   Brain mass 03/09/2021   PVFS (postviral fatigue syndrome) 02/10/2021   Syncope 02/10/2021   Chronic insomnia 02/10/2021   Palpitations 04/10/2020   Essential hypertension 04/10/2020   Tear of lateral cartilage or meniscus of knee, current 12/16/2011    ONSET DATE: ~2 weeks ago  REFERRING DIAG: G93.89 (ICD-10-CM) - Brain mass   THERAPY DIAG:  Cognitive communication deficit  Muscle weakness (generalized)  Unsteadiness on feet  Other abnormalities of gait and mobility  Rationale for Evaluation and Treatment: Rehabilitation  SUBJECTIVE:                                                                                                                                                                                              SUBJECTIVE STATEMENT: Patient reports he continues to have difficulty with sleeping due to headaches. Patient states his legs are feeling better, less "rubbery". Patient states he has not had anything to eat prior to therapy today.  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 Yes: NPRS scale: 7/10 Pain location: Front temple into behind R ear Pain description: Throbbing Aggravating  factors: light Relieving factors: nothing thus far  PRECAUTIONS: None  WEIGHT BEARING RESTRICTIONS: No  FALLS: Has patient fallen in last 6 months? No  LIVING ENVIRONMENT: Lives with: lives with their spouse, [name] Lives in: House/apartment Stairs: Yes: Internal: 16 steps; on right going up and External: 2 steps; none Has following equipment at home: None  PLOF: Independent Pt sings for work Teacher, English as a foreign language) -- next gig is supposed to be next week but they had to cancel  PATIENT GOALS: Improve strength and cognition to return to work  OBJECTIVE:   DIAGNOSTIC FINDINGS: [facility] 05/11/22 IMPRESSION: 1. Postop right temporal craniotomy for biopsy. 2. 3 mm left hemispheric subdural hematoma. Mild subdural hematoma along the posterior falx and bilateral tentorium. 3. Lobular extra-axial soft tissue mass in the basilar cisterns appears similar to the prior MRI. 4. These results were called by telephone at the time of interpretation on 05/11/2022 at 5:12 pm to provider Ostergard , who verbally acknowledged these results.  COGNITION: Overall cognitive status:  reports impaired memory and cognition   SENSATION: WFL "leg feels like rubber"  COORDINATION: Slow with all movements  EDEMA:  None  MUSCLE TONE: None noted  MUSCLE LENGTH: Did not assess  DTRs: Did not assess  POSTURE: Holds head in flexed position, rounded shoulders/thoracic  spine due to pain/fatigue  CERVICAL ROM:   Active ROM A/PROM (deg) eval  Flexion 60  Extension 20*  Right lateral flexion 40  Left lateral flexion 30  Right rotation 40  Left rotation 45   (Blank rows = not tested)  * = pain Feels like head drops to the right  LOWER EXTREMITY ROM:   Grossly WFL  LOWER EXTREMITY MMT:    MMT Right Eval Left Eval  Hip flexion 3+ 4  Hip extension 4- 4-  Hip abduction 3+ 4-  Hip adduction    Hip internal rotation    Hip external rotation    Knee flexion 4 4  Knee extension 4 4  Ankle dorsiflexion    Ankle plantarflexion    Ankle inversion    Ankle eversion    (Blank rows = not tested)  UPPER EXTREMITY MMT: grossly 3+/5 bilat  BED MOBILITY: Independent but slow  TRANSFERS: Assistive device utilized: None  Sit to stand: Complete Independence Stand to sit: Complete Independence Chair to chair: Complete Independence Floor:  did not assess   STAIRS: fatigued after strength and balance testing to assess  GAIT: Gait pattern: step through pattern, shuffling, lateral lean- Right, and lateral lean- Left Distance walked: to back of clinic Assistive device utilized: None Level of assistance: Complete Independence Comments: n/a  FUNCTIONAL TESTS:  5 times sit to stand: 22.95 sec Berg Balance Scale: 47/56 Dynamic Gait Index: TBA 1/25: [place] 13/24    PATIENT SURVEYS:  FOTO not appropriate  TODAY'S TREATMENT:    [facility] Adult PT Treatment:                                                DATE: 05/31/2022 Therapeutic Exercise: Supine, 3#dowel: Shoulder flexion --> added pool noodle Seated shoulder horizontal abd YTB  Standing shoulder extension w/dowel (discontinued d/t increased dizziness)  Manual Therapy: STM cervical paraspinals, cervical upglides, SO/SOR  Therapeutic Activity: Dual tasking: Fwd walking + counting by 2s Side stepping w/3#dowel CP/OP + singing ABCs STS x10 cradling 6#MB + singing song --> added overhead press  x10 Fwd diagonal NBOS stepping + naming alphabetical cities (GTB) Standing marching + singing (GTB)   [facility] Adult PT Treatment:                                                DATE: 05/27/2022 Therapeutic Exercise: Supine, 3#dowel: Straight arm punch up Shoulder flexion Shoulder horizontal abd RTB Chin tucks  Seated: Bent arm shoulder ER x10 --> YTB 2x10 STS x10 cradling 6#MB +  naming song titles  Therapeutic Activity: DHI Dynamic weight shifting: Colored dot call out --> clock toe tap call out    PATIENT EDUCATION: Education details: Monitoring pain level (don't push through) Person educated: Patient Education method: Consulting civil engineer, Media planner, and Handouts Education comprehension: verbalized understanding, returned demonstration, and needs further education  HOME EXERCISE PROGRAM: Access Code: [place] URL: [email or web address] Date: 05/25/2022 Prepared by: Estill Bamberg April Thurnell Garbe  Exercises - Supine Shoulder Flexion Extension AAROM with Dowel  - 1 x daily - 7 x weekly - 2 sets - 10 reps - Supine Scapular Retraction  - 1 x daily - 7 x weekly - 2 sets - 10 reps - 3 sec hold - Supine Chin Tuck  - 1 x daily - 7 x weekly - 2 sets - 10 reps - 3 sec hold - Supine Bridge  - 1 x daily - 7 x weekly - 2 sets - 10 reps - Clamshell  - 1 x daily - 7 x weekly - 2 sets - 10 reps   GOALS: Goals reviewed with patient? Yes  SHORT TERM GOALS: Target date: 06/23/2022   Pt will be ind with initial HEP Baseline: Goal status: INITIAL  2.  Pt will demo improved 5x STS to </=15 sec for decreased fall risk Baseline:  Goal status: INITIAL  3.  Pt will demo improved cervical ROM for lateral flexion and rotation by >/=10 deg Baseline:  Goal status: INITIAL   LONG TERM GOALS: Target date: 07/21/2022   Pt will be ind with progression and advancement of HEP Baseline:  Goal status: INITIAL  2.  Pt will have improved Berg Balance Score to >/=52/56 for decreased fall  risk Baseline:  Goal status: INITIAL  3.  Pt will improve 5x STS to </=13 sec to decrease fall risk Baseline:  Goal status: INITIAL  4.  Pt will demo enough endurance to get through a full setlist of songs for return to work Baseline:  Goal status: INITIAL  5.  Pt will report improved headache by >/=50% Baseline:  Goal status: INITIAL   ASSESSMENT:  CLINICAL IMPRESSION:  Focus on dual tasking continued to progress endurance and cognitive function. Significant tension noted on right side during STM in sub-occipital region. Hip strengthening and dynamic balance progressed with resisted ambulatory exercise in multiple planes. During standing shoulder extension exercise with dowel, patient reported exacerbation of symptoms with increased dizziness.   OBJECTIVE IMPAIRMENTS: Abnormal gait, decreased activity tolerance, decreased balance, decreased endurance, decreased mobility, difficulty walking, decreased ROM, decreased strength, impaired sensation, impaired UE functional use, improper body mechanics, postural dysfunction, and pain.   ACTIVITY LIMITATIONS: bending, standing, squatting, locomotion level, and caring for others  PARTICIPATION LIMITATIONS: cleaning, laundry, driving, shopping, community activity, occupation, and yard work  PERSONAL FACTORS: Past/current experiences and Time since onset of injury/illness/exacerbation are also affecting patient's functional outcome.   REHAB POTENTIAL: Good  CLINICAL DECISION MAKING: Evolving/moderate complexity  EVALUATION COMPLEXITY: Moderate  PLAN:  PT FREQUENCY: 2x/week  PT DURATION: 8 weeks  PLANNED INTERVENTIONS: Therapeutic exercises, Therapeutic activity, Neuromuscular re-education, Balance training, Gait training, Patient/Family education, Self Care, Joint mobilization, Stair training, Vestibular training, Aquatic Therapy, Dry Needling, Electrical stimulation, Spinal mobilization, Cryotherapy, Moist heat, Taping, Traction,  Ionotophoresis '4mg'$ /ml Dexamethasone, Manual therapy, and Re-evaluation  PLAN FOR NEXT SESSION: Dual tasking (singing); Continue to work on strength/endurance/balance.   Hardin Negus, PTA 05/31/2022, 11:19 AM

## 2022-06-03 ENCOUNTER — Ambulatory Visit: Payer: BC Managed Care – PPO | Attending: Neurological Surgery | Admitting: Physical Therapy

## 2022-06-03 ENCOUNTER — Encounter: Payer: Self-pay | Admitting: Physical Therapy

## 2022-06-03 DIAGNOSIS — M6281 Muscle weakness (generalized): Secondary | ICD-10-CM | POA: Diagnosis not present

## 2022-06-03 DIAGNOSIS — R41841 Cognitive communication deficit: Secondary | ICD-10-CM | POA: Insufficient documentation

## 2022-06-03 DIAGNOSIS — R2689 Other abnormalities of gait and mobility: Secondary | ICD-10-CM | POA: Insufficient documentation

## 2022-06-03 DIAGNOSIS — R2681 Unsteadiness on feet: Secondary | ICD-10-CM | POA: Insufficient documentation

## 2022-06-03 NOTE — Therapy (Signed)
OUTPATIENT PHYSICAL THERAPY TREATMENT   Patient Name: Sean Carrillo. MRN: [id number] DOB:03/06/1966, 57 y.o., male Today's Date: 06/03/2022   PCP: Raelyn Number REFERRING PROVIDER: Emelda Brothers  END OF SESSION:  PT End of Session - 06/03/22 1144     Visit Number 4    Number of Visits 16    Date for PT Re-Evaluation 07/20/22    Authorization Type BCBS    PT Start Time 1145    PT Stop Time 1230    PT Time Calculation (min) 45 min    Activity Tolerance Patient tolerated treatment well             Past Medical History:  Diagnosis Date   Brain tumor ([place])    Hyperlipidemia    Hypertension    Paralysis ([place])    Past Surgical History:  Procedure Laterality Date   APPLICATION OF CRANIAL NAVIGATION N/A 05/10/2022   Procedure: APPLICATION OF CRANIAL NAVIGATION;  Surgeon: Judith Part, MD;  Location: [place];  Service: Neurosurgery;  Laterality: N/A;   Arthroscopic knee surgery     CHOLECYSTECTOMY     CRANIOTOMY Right 05/10/2022   Procedure: Right craniotomy for tumor resection;  Surgeon: Judith Part, MD;  Location: [place];  Service: Neurosurgery;  Laterality: Right;  RM 21   Patient Active Problem List   Diagnosis Date Noted   Bradycardia 05/13/2022   Brain tumor ([place]) 05/10/2022   Brain mass 03/09/2021   PVFS (postviral fatigue syndrome) 02/10/2021   Syncope 02/10/2021   Chronic insomnia 02/10/2021   Palpitations 04/10/2020   Essential hypertension 04/10/2020   Tear of lateral cartilage or meniscus of knee, current 12/16/2011    ONSET DATE: ~2 weeks ago  REFERRING DIAG: G93.89 (ICD-10-CM) - Brain mass   THERAPY DIAG:  Muscle weakness (generalized)  Unsteadiness on feet  Other abnormalities of gait and mobility  Rationale for Evaluation and Treatment: Rehabilitation  SUBJECTIVE:                                                                                                                                                                                              SUBJECTIVE STATEMENT: Pt was able to see Speech Therapist. Pt still doesn't have results of biopsy. Has not been able to talk to MD about something for headaches. Pt states he tried to walk a block outside but had to keep stopping.   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 Yes: NPRS scale: 4.5/10  Pain location: Front temple into behind R ear Pain description: Throbbing Aggravating factors: light Relieving factors: nothing thus far  PRECAUTIONS: None  WEIGHT BEARING RESTRICTIONS: No  FALLS: Has patient fallen in last 6 months? 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 Postop right temporal craniotomy for biopsy. 2. 3 mm left hemispheric subdural hematoma. Mild subdural hematoma along the posterior falx and bilateral tentorium. 3. Lobular extra-axial soft tissue mass in the basilar cisterns appears similar to the prior MRI. 4. These results were called by telephone at the time of interpretation on 05/11/2022 at 5:12 pm to provider Ostergard , who verbally acknowledged these results.  COGNITION: Overall cognitive status:  reports impaired memory and cognition   SENSATION: WFL "leg feels like rubber"  COORDINATION: Slow with all movements  EDEMA:  None  MUSCLE TONE: None noted  MUSCLE LENGTH: Did not assess  DTRs: Did not assess  POSTURE: Holds head in flexed position,  rounded shoulders/thoracic spine due to pain/fatigue  CERVICAL ROM:   Active ROM A/PROM (deg) eval  Flexion 60  Extension 20*  Right lateral flexion 40  Left lateral flexion 30  Right rotation 40  Left rotation 45   (Blank rows = not tested)  * = pain Feels like head drops to the right  LOWER EXTREMITY ROM:   Grossly WFL  LOWER EXTREMITY MMT:    MMT Right Eval Left Eval  Hip flexion 3+ 4  Hip extension 4- 4-  Hip abduction 3+ 4-  Hip adduction    Hip internal rotation    Hip external rotation    Knee flexion 4 4  Knee extension 4 4  Ankle dorsiflexion    Ankle plantarflexion    Ankle inversion    Ankle eversion    (Blank rows = not tested)  UPPER EXTREMITY MMT: grossly 3+/5 bilat  BED MOBILITY: Independent but slow  TRANSFERS: Assistive device utilized: None  Sit to stand: Complete Independence Stand to sit: Complete Independence Chair to chair: Complete Independence Floor:  did not assess   STAIRS: fatigued after strength and balance testing to assess  GAIT: Gait pattern: step through pattern, shuffling, lateral lean- Right, and lateral lean- Left Distance walked: to back of clinic Assistive device utilized: None Level of assistance: Complete Independence Comments: n/a  FUNCTIONAL TESTS:  5 times sit to stand: 22.95 sec Berg Balance Scale: 47/56 Dynamic Gait Index: TBA 1/25: [place] 13/24    PATIENT SURVEYS:  FOTO not appropriate  TODAY'S TREATMENT:    [facility] Adult PT Treatment:                                                DATE: 06/03/22 Therapeutic Exercise: Quad stretch 2x 30 sec Hamstring stretch 2x30 sec  Therapeutic Activity: Recumbent bike L3 x 10 min while singing to work on endurance, maintaining >/=55 RPM Forward step ups 6" step 2x2 min with singing Side step ups 6" step 2x2 min with singing Manual Therapy: STM  cervical paraspinals, cervical upglides, SO/SOR   [facility] Adult PT Treatment:                                                 DATE: 05/31/2022 Therapeutic Exercise: Supine, 3#dowel: Shoulder flexion --> added pool noodle Seated shoulder horizontal abd YTB  Standing shoulder extension w/dowel (discontinued d/t increased dizziness)  Manual Therapy: STM cervical paraspinals, cervical upglides, SO/SOR Therapeutic Activity: Dual tasking: Fwd walking + counting by 2s Side stepping w/3#dowel CP/OP + singing ABCs STS x10 cradling 6#MB + singing song --> added overhead press x10 Fwd diagonal NBOS stepping + naming alphabetical cities (GTB) Standing marching + singing (GTB)    PATIENT EDUCATION: Education details: Monitoring pain level (don't push through) Person educated: Patient Education method: Consulting civil engineer, Media planner, and Handouts Education comprehension: verbalized understanding, returned demonstration, and needs further education  HOME EXERCISE PROGRAM: Access Code: [place] URL: [email or web address] Date: 05/25/2022 Prepared by: Estill Bamberg April Thurnell Garbe  Exercises - Supine Shoulder Flexion Extension AAROM with Dowel  - 1 x daily - 7 x weekly - 2 sets - 10 reps - Supine Scapular Retraction  - 1 x daily - 7 x weekly - 2 sets - 10 reps - 3 sec hold - Supine Chin Tuck  - 1 x daily - 7 x weekly - 2 sets - 10 reps - 3 sec hold - Supine Bridge  - 1 x daily - 7 x weekly - 2 sets - 10 reps - Clamshell  - 1 x daily - 7 x weekly - 2 sets - 10 reps   GOALS: Goals reviewed with patient? Yes  SHORT TERM GOALS: Target date: 06/23/2022   Pt will be ind with initial HEP Baseline: Goal status: INITIAL  2.  Pt will demo improved 5x STS to </=15 sec for decreased fall risk Baseline:  Goal status: INITIAL  3.  Pt will demo improved cervical ROM for lateral flexion and rotation by >/=10 deg Baseline:  Goal status: INITIAL   LONG TERM GOALS: Target date: 07/21/2022   Pt will be ind with progression and advancement of HEP Baseline:  Goal status: INITIAL  2.  Pt will have improved Berg  Balance Score to >/=52/56 for decreased fall risk Baseline:  Goal status: INITIAL  3.  Pt will improve 5x STS to </=13 sec to decrease fall risk Baseline:  Goal status: INITIAL  4.  Pt will demo enough endurance to get through a full setlist of songs for return to work Baseline:  Goal status: INITIAL  5.  Pt will report improved headache by >/=50% Baseline:  Goal status: INITIAL   ASSESSMENT:  CLINICAL IMPRESSION:  Session focused primarily on endurance this session as well as breathing (with singing). Tolerated well.   OBJECTIVE IMPAIRMENTS: Abnormal gait, decreased activity tolerance, decreased balance, decreased endurance, decreased mobility, difficulty walking, decreased ROM, decreased strength, impaired sensation, impaired UE functional use, improper body mechanics, postural dysfunction, and pain.    PLAN:  PT FREQUENCY: 2x/week  PT DURATION: 8 weeks  PLANNED INTERVENTIONS: Therapeutic exercises, Therapeutic activity, Neuromuscular re-education, Balance training, Gait training, Patient/Family education, Self Care, Joint mobilization, Stair training, Vestibular training, Aquatic Therapy, Dry Needling, Electrical stimulation, Spinal mobilization, Cryotherapy, Moist heat, Taping, Traction, Ionotophoresis '4mg'$ /ml Dexamethasone, Manual therapy, and Re-evaluation  PLAN FOR NEXT SESSION: Dual tasking (singing); Continue to work on strength/endurance/balance.   Tristyn Pharris April Ma  L Jaivyn Gulla, PT 06/03/2022, 11:44 AM

## 2022-06-07 ENCOUNTER — Ambulatory Visit: Payer: BC Managed Care – PPO

## 2022-06-07 DIAGNOSIS — R001 Bradycardia, unspecified: Secondary | ICD-10-CM | POA: Diagnosis not present

## 2022-06-07 DIAGNOSIS — R41841 Cognitive communication deficit: Secondary | ICD-10-CM

## 2022-06-07 DIAGNOSIS — M6281 Muscle weakness (generalized): Secondary | ICD-10-CM

## 2022-06-07 DIAGNOSIS — R2681 Unsteadiness on feet: Secondary | ICD-10-CM

## 2022-06-07 DIAGNOSIS — R2689 Other abnormalities of gait and mobility: Secondary | ICD-10-CM

## 2022-06-07 NOTE — Therapy (Signed)
OUTPATIENT PHYSICAL THERAPY TREATMENT   Patient Name: Sean Carrillo. MRN: [id number] DOB:1965-12-31, 57 y.o., male Today's Date: 06/07/2022   PCP: Raelyn Number REFERRING PROVIDER: Emelda Brothers  END OF SESSION:  PT End of Session - 06/07/22 1017     Visit Number 5    Number of Visits 16    Date for PT Re-Evaluation 07/20/22    Authorization Type BCBS    PT Start Time 1018    PT Stop Time 1100    PT Time Calculation (min) 42 min    Activity Tolerance Patient tolerated treatment well    Behavior During Therapy WFL for tasks assessed/performed             Past Medical History:  Diagnosis Date   Brain tumor ([place])    Hyperlipidemia    Hypertension    Paralysis ([place])    Past Surgical History:  Procedure Laterality Date   APPLICATION OF CRANIAL NAVIGATION N/A 05/10/2022   Procedure: APPLICATION OF CRANIAL NAVIGATION;  Surgeon: Judith Part, MD;  Location: [place];  Service: Neurosurgery;  Laterality: N/A;   Arthroscopic knee surgery     CHOLECYSTECTOMY     CRANIOTOMY Right 05/10/2022   Procedure: Right craniotomy for tumor resection;  Surgeon: Judith Part, MD;  Location: [place];  Service: Neurosurgery;  Laterality: Right;  RM 21   Patient Active Problem List   Diagnosis Date Noted   Bradycardia 05/13/2022   Brain tumor ([place]) 05/10/2022   Brain mass 03/09/2021   PVFS (postviral fatigue syndrome) 02/10/2021   Syncope 02/10/2021   Chronic insomnia 02/10/2021   Palpitations 04/10/2020   Essential hypertension 04/10/2020   Tear of lateral cartilage or meniscus of knee, current 12/16/2011    ONSET DATE: ~2 weeks ago  REFERRING DIAG: G93.89 (ICD-10-CM) - Brain mass   THERAPY DIAG:  Muscle weakness (generalized)  Unsteadiness on feet  Other abnormalities of gait and mobility  Cognitive communication deficit  Rationale for Evaluation and Treatment: Rehabilitation  SUBJECTIVE:                                                                                                                                                                                              SUBJECTIVE STATEMENT: Patient reports 2-3/10 pain today, states the headaches are not bad today. Patient states he walked yesterday a few times. Patient states he continues to feel "stopped up" in his head.   PERTINENT HISTORY: Patient was admitted 1/8 to 05/13/22 following an uncomplicated right sided craniotomy for biopsy and partial resection of a midline skull base mass   PAIN:  Are you having  pain? Yes: NPRS scale: 4.5/10 Pain location: Front temple into behind R ear Pain description: Throbbing Aggravating factors: light Relieving factors: nothing thus far  PRECAUTIONS: None  WEIGHT BEARING RESTRICTIONS: No  FALLS: Has patient fallen in last 6 months? 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 Postop right temporal craniotomy for biopsy. 2. 3 mm left hemispheric subdural hematoma. Mild subdural hematoma along the posterior falx and bilateral tentorium. 3. Lobular extra-axial soft tissue mass in the basilar cisterns appears similar to the prior MRI. 4. These results were called by telephone at the time of interpretation on 05/11/2022 at 5:12 pm to provider Ostergard , who verbally acknowledged these results.  COGNITION: Overall cognitive status:  reports impaired memory and cognition   SENSATION: WFL "leg feels like rubber"  COORDINATION: Slow with all movements  EDEMA:  None  MUSCLE TONE: None noted  MUSCLE LENGTH: Did not  assess  DTRs: Did not assess  POSTURE: Holds head in flexed position, rounded shoulders/thoracic spine due to pain/fatigue  CERVICAL ROM:   Active ROM A/PROM (deg) eval  Flexion 60  Extension 20*  Right lateral flexion 40  Left lateral flexion 30  Right rotation 40  Left rotation 45   (Blank rows = not tested)  * = pain Feels like head drops to the right  LOWER EXTREMITY ROM:   Grossly WFL  LOWER EXTREMITY MMT:    MMT Right Eval Left Eval  Hip flexion 3+ 4  Hip extension 4- 4-  Hip abduction 3+ 4-  Hip adduction    Hip internal rotation    Hip external rotation    Knee flexion 4 4  Knee extension 4 4  Ankle dorsiflexion    Ankle plantarflexion    Ankle inversion    Ankle eversion    (Blank rows = not tested)  UPPER EXTREMITY MMT: grossly 3+/5 bilat  BED MOBILITY: Independent but slow  TRANSFERS: Assistive device utilized: None  Sit to stand: Complete Independence Stand to sit: Complete Independence Chair to chair: Complete Independence Floor:  did not assess   STAIRS: fatigued after strength and balance testing to assess  GAIT: Gait pattern: step through pattern, shuffling, lateral lean- Right, and lateral lean- Left Distance walked: to back of clinic Assistive device utilized: None Level of assistance: Complete Independence Comments: n/a  FUNCTIONAL TESTS:  5 times sit to stand: 22.95 sec Berg Balance Scale: 47/56 Dynamic Gait Index: TBA 1/25: [place] 13/24    PATIENT SURVEYS:  FOTO not appropriate   TODAY'S TREATMENT:    [facility] Adult PT Treatment:                                                DATE: 06/07/2022 Therapeutic Exercise: Standing quad and HS stretches (plinth for support)  Gastroc/soleus stretches 2x30" each Heel raises 2x10  Manual Therapy: STM cervical paraspinals, cervical upglides, SO/SOR Therapeutic Activity: Treadmill: 1.5 mph, HHAx2 + singing x 54mn --> added low incline x 544m STS  with 6#MB + overhead reach in standing  + singing scales 3x10    [facility] Adult PT Treatment:                                                DATE: 06/03/22 Therapeutic Exercise: Quad stretch 2x 30 sec Hamstring stretch 2x30 sec  Therapeutic Activity: Recumbent bike L3 x 10 min while singing to work on endurance, maintaining >/=55 RPM Forward step ups 6" step 2x2 min with singing Side step ups 6" step 2x2 min with singing Manual Therapy: STM cervical paraspinals, cervical upglides, SO/SOR   PATIENT EDUCATION: Education details: Walking longer distances + singing Person educated: Patient Education method: Consulting civil engineer, Media planner, and Handouts Education comprehension: verbalized understanding, returned demonstration, and needs further education  HOME EXERCISE PROGRAM: Access Code: [place] URL: [email or web address] Date: 05/25/2022 Prepared by: Estill Bamberg April Thurnell Garbe  Exercises - Supine Shoulder Flexion Extension AAROM with Dowel  - 1 x daily - 7 x weekly - 2 sets - 10 reps - Supine Scapular Retraction  - 1 x daily - 7 x weekly - 2 sets - 10 reps - 3 sec hold - Supine Chin Tuck  - 1 x daily - 7 x weekly - 2 sets - 10 reps - 3 sec hold - Supine Bridge  - 1 x daily - 7 x weekly - 2 sets - 10 reps - Clamshell  - 1 x daily - 7 x weekly - 2 sets - 10 reps   GOALS: Goals reviewed with patient? Yes  SHORT TERM GOALS: Target date: 06/23/2022   Pt will be ind with initial HEP Baseline: Goal status: INITIAL  2.  Pt will demo improved 5x STS to </=15 sec for decreased fall risk Baseline:  Goal status: INITIAL  3.  Pt will demo improved cervical ROM for lateral flexion and rotation by >/=10 deg Baseline:  Goal status: INITIAL   LONG TERM GOALS: Target date: 07/21/2022   Pt will be ind with progression and advancement of HEP Baseline:  Goal status: INITIAL  2.  Pt will have improved Berg Balance Score to >/=52/56 for decreased fall risk Baseline:  Goal status: INITIAL  3.  Pt will improve 5x  STS to </=13 sec to decrease fall risk Baseline:  Goal status: INITIAL  4.  Pt will demo enough endurance to get through a full setlist of songs for return to work Baseline:  Goal status: INITIAL  5.  Pt will report improved headache by >/=50% Baseline:  Goal status: INITIAL   ASSESSMENT:  CLINICAL IMPRESSION:  Continued dual tasking interventions with focus on endurance training. Cueing provided to improve upright standing posture during treadmill activity. Patient demonstrates moderate improvement in endurance and tolerance with dual tasking activities as compared to first PT session.  OBJECTIVE IMPAIRMENTS: Abnormal gait, decreased activity tolerance, decreased balance, decreased endurance, decreased mobility, difficulty walking, decreased ROM, decreased strength, impaired sensation, impaired UE functional use, improper body mechanics, postural dysfunction, and pain.    PLAN:  PT FREQUENCY: 2x/week  PT DURATION: 8 weeks  PLANNED INTERVENTIONS: Therapeutic exercises, Therapeutic activity, Neuromuscular re-education, Balance training, Gait training, Patient/Family education, Self Care, Joint mobilization, Stair training, Vestibular training, Aquatic Therapy, Dry Needling, Electrical stimulation, Spinal mobilization, Cryotherapy, Moist heat, Taping, Traction, Ionotophoresis '4mg'$ /ml Dexamethasone, Manual therapy, and Re-evaluation  PLAN FOR NEXT SESSION: Dual tasking (singing); Continue to work on strength/endurance/balance.  Manual as needed to decrease cervical tension/headaches.  Hardin Negus, PTA 06/07/2022, 11:01 AM

## 2022-06-09 ENCOUNTER — Ambulatory Visit: Payer: BC Managed Care – PPO | Attending: Neurological Surgery

## 2022-06-09 DIAGNOSIS — R41841 Cognitive communication deficit: Secondary | ICD-10-CM | POA: Diagnosis not present

## 2022-06-09 NOTE — Therapy (Signed)
OUTPATIENT SPEECH LANGUAGE PATHOLOGY TREATMENT   Patient Name: Sean Carrillo. MRN: [id number] DOB:[birth year], 57 y.o., male Today's Date: 06/09/2022  PCP: Trey Sailors. PA REFERRING PROVIDER: Judith Part, MD  END OF SESSION:  End of Session - 06/09/22 1032     Visit Number 2    Number of Visits 17    Date for SLP Re-Evaluation 07/29/22    SLP Start Time 1023    SLP Stop Time  1102    SLP Time Calculation (min) 39 min    Activity Tolerance Patient tolerated treatment well             Past Medical History:  Diagnosis Date   Brain tumor ([place])    Hyperlipidemia    Hypertension    Paralysis ([place])    Past Surgical History:  Procedure Laterality Date   APPLICATION OF CRANIAL NAVIGATION N/A 05/10/2022   Procedure: APPLICATION OF CRANIAL NAVIGATION;  Surgeon: Judith Part, MD;  Location: [place];  Service: Neurosurgery;  Laterality: N/A;   Arthroscopic knee surgery     CHOLECYSTECTOMY     CRANIOTOMY Right 05/10/2022   Procedure: Right craniotomy for tumor resection;  Surgeon: Judith Part, MD;  Location: [place];  Service: Neurosurgery;  Laterality: Right;  RM 21   Patient Active Problem List   Diagnosis Date Noted   Bradycardia 05/13/2022   Brain tumor ([place]) 05/10/2022   Brain mass 03/09/2021   PVFS (postviral fatigue syndrome) 02/10/2021   Syncope 02/10/2021   Chronic insomnia 02/10/2021   Palpitations 04/10/2020   Essential hypertension 04/10/2020   Tear of lateral cartilage or meniscus of knee, current 12/16/2011    ONSET DATE: 05-10-22   REFERRING DIAG: D49.6 (ICD-10-CM) - Neoplasm of unspecified behavior of brain  THERAPY DIAG:  Cognitive communication deficit  Rationale for Evaluation and Treatment: Rehabilitation  SUBJECTIVE:   SUBJECTIVE STATEMENT: "I had a rehearsal yesterday." Pt accompanied by: self  PERTINENT HISTORY: Patient was admitted 1/8 to 05/13/22 following an uncomplicated right sided craniotomy for biopsy  and 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 Brain surgery 2-3 weeks ago. Pain has been bad since. Has tried pain medicine but has not had a good response. Pt notes increased weakness. Balance has been off as noted by wife. Wife reports pt has not been eating as much. Pt notes R eye is not the best right now. Pt reports difficulty with recall -- worried as this may affect his work.  PAIN:  Are you having pain? Yes: NPRS scale: 210 Pain location: posterior rt head Pain description: HA   PATIENT GOALS: "I want to perform again."  OBJECTIVE:  Today's Treatment 06/09/22: SLP addressed songs pt did not recall details about during the rehearsal, and encouraged him to practice these songs - pt did not write down which songs were problematic so SLP provided pt with pencil/paper and he wrote them down. 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 SLP then talked with pt about med administration - he put alarm in his phone and generated a plan to take his meds in the morning indpendently. Pt spontaneously wrote down he needs to tell wife he is taking over meds and needs her to ensure proper administration for 4-5 days, and to inquire of her if she has seen any other areas that are deficient.  Pt is independent with bill pay (via phone) without  skipping/missing/late payments, and tracking appointments (via phone) independently.   PATIENT EDUCATION: Education details: Pt was told to watch old recordings of shows and take notes on the details and also to sing with the shows with his notes, to foster recall of details and sequence. Person educated: Patient Education method: Explanation and Handouts Education comprehension: verbalized understanding   GOALS: Goals reviewed with patient? No  SHORT TERM GOALS: Target date: 06/30/22  Pt will bring memory system to 60% of sessions Baseline: 1/1 Goal status: Ongoing  2.  Pt will  demo use/report use of memory system with extended time allowed, in/between 4 sessions Baseline: 06/09/22 Goal status: Ongoing  3.  Pt will undergo further cognitive linguistic testing Baseline:  Goal status: Ongoing   LONG TERM GOALS: Target date: 07/29/22  Pt will score higher/better on PROM than initial administration Baseline:  Goal status: Ongoing  2.  Pt will complete medication administration tasks with modified independence over two weeks Baseline:  Goal status: Ongoing  3.  Pt will utilize memory strategies/system successfully over 3 weeks Baseline:  Goal status: Ongoing   ASSESSMENT:  CLINICAL IMPRESSION: Patient is a 57 y.o. male who was seen today for treatment of cognitive linguistics/memory. He is beginning to use memory strategies for both home and vocational situations, and is finding his long term memory for details of songs is slowly returning. He needs recall to perform tasks both at work and at home.   OBJECTIVE IMPAIRMENTS: include attention and memory. These impairments are limiting patient from return to work, managing medications, managing appointments, managing finances, household responsibilities, ADLs/IADLs, and effectively communicating at home and in community. Factors affecting potential to achieve goals and functional outcome are co-morbidities and severity of impairments.. Patient will benefit from skilled SLP services to address above impairments and improve overall function.  REHAB POTENTIAL: Good  PLAN:  SLP FREQUENCY: 2x/week  SLP DURATION: 8 weeks  PLANNED INTERVENTIONS: Environmental controls, Cueing hierachy, Cognitive reorganization, Internal/external aids, Functional tasks, SLP instruction and feedback, Compensatory strategies, and Patient/family education    [facility], [place] 06/09/2022, 10:33 AM

## 2022-06-10 ENCOUNTER — Encounter: Payer: Self-pay | Admitting: Physical Therapy

## 2022-06-10 ENCOUNTER — Ambulatory Visit: Payer: BC Managed Care – PPO | Admitting: Physical Therapy

## 2022-06-10 DIAGNOSIS — R2681 Unsteadiness on feet: Secondary | ICD-10-CM

## 2022-06-10 DIAGNOSIS — R2689 Other abnormalities of gait and mobility: Secondary | ICD-10-CM

## 2022-06-10 DIAGNOSIS — M6281 Muscle weakness (generalized): Secondary | ICD-10-CM

## 2022-06-10 DIAGNOSIS — R41841 Cognitive communication deficit: Secondary | ICD-10-CM | POA: Diagnosis not present

## 2022-06-10 NOTE — Therapy (Signed)
OUTPATIENT PHYSICAL THERAPY TREATMENT   Patient Name: Sean Carrillo. MRN: [id number] DOB:[birth year], 57 y.o., male Today's Date: 06/10/2022   PCP: Raelyn Number REFERRING PROVIDER: Emelda Brothers  END OF SESSION:  PT End of Session - 06/10/22 1155     Visit Number 6    Number of Visits 16    Date for PT Re-Evaluation 07/20/22    Authorization Type BCBS    PT Start Time 1150    PT Stop Time 1230    PT Time Calculation (min) 40 min    Activity Tolerance Patient tolerated treatment well    Behavior During Therapy WFL for tasks assessed/performed             Past Medical History:  Diagnosis Date   Brain tumor ([place])    Hyperlipidemia    Hypertension    Paralysis ([place])    Past Surgical History:  Procedure Laterality Date   APPLICATION OF CRANIAL NAVIGATION N/A 05/10/2022   Procedure: APPLICATION OF CRANIAL NAVIGATION;  Surgeon: Judith Part, MD;  Location: [place];  Service: Neurosurgery;  Laterality: N/A;   Arthroscopic knee surgery     CHOLECYSTECTOMY     CRANIOTOMY Right 05/10/2022   Procedure: Right craniotomy for tumor resection;  Surgeon: Judith Part, MD;  Location: [place];  Service: Neurosurgery;  Laterality: Right;  RM 21   Patient Active Problem List   Diagnosis Date Noted   Bradycardia 05/13/2022   Brain tumor ([place]) 05/10/2022   Brain mass 03/09/2021   PVFS (postviral fatigue syndrome) 02/10/2021   Syncope 02/10/2021   Chronic insomnia 02/10/2021   Palpitations 04/10/2020   Essential hypertension 04/10/2020   Tear of lateral cartilage or meniscus of knee, current 12/16/2011    ONSET DATE: ~2 weeks ago  REFERRING DIAG: G93.89 (ICD-10-CM) - Brain mass   THERAPY DIAG:  Muscle weakness (generalized)  Unsteadiness on feet  Other abnormalities of gait and mobility  Rationale for Evaluation and Treatment: Rehabilitation  SUBJECTIVE:                                                                                                                                                                                              SUBJECTIVE STATEMENT: Pt reports no new issues. States continued double vision at times. Notes improvements in endurance and decrease in HA but still present. Still hasn't received results of biopsy yet. States his next show is to be on March 8.   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 Yes: NPRS scale: 4.5/10 Pain location: Front temple into behind R ear Pain description: Throbbing Aggravating factors: light Relieving factors: nothing thus far  PRECAUTIONS: None  WEIGHT BEARING RESTRICTIONS: No  FALLS: Has patient fallen in last 6 months? 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 Postop right temporal craniotomy for biopsy. 2. 3 mm left hemispheric subdural hematoma. Mild subdural hematoma along the posterior falx and bilateral tentorium. 3. Lobular extra-axial soft tissue mass in the basilar cisterns appears similar to the prior MRI. 4. These results were called by telephone at the time of interpretation on 05/11/2022 at 5:12 pm to provider Ostergard , who verbally acknowledged these results.  COGNITION: Overall cognitive status:  reports impaired memory and cognition   SENSATION: WFL "leg feels like rubber"  COORDINATION: Slow with all movements  EDEMA:  None  MUSCLE TONE: None noted  MUSCLE LENGTH: Did not  assess  DTRs: Did not assess  POSTURE: Holds head in flexed position, rounded shoulders/thoracic spine due to pain/fatigue  CERVICAL ROM:   Active ROM A/PROM (deg) eval  Flexion 60  Extension 20*  Right lateral flexion 40  Left lateral flexion 30  Right rotation 40  Left rotation 45   (Blank rows = not tested)  * = pain Feels like head drops to the right  LOWER EXTREMITY ROM:   Grossly WFL  LOWER EXTREMITY MMT:    MMT Right Eval Left Eval  Hip flexion 3+ 4  Hip extension 4- 4-  Hip abduction 3+ 4-  Hip adduction    Hip internal rotation    Hip external rotation    Knee flexion 4 4  Knee extension 4 4  Ankle dorsiflexion    Ankle plantarflexion    Ankle inversion    Ankle eversion    (Blank rows = not tested)  UPPER EXTREMITY MMT: grossly 3+/5 bilat  BED MOBILITY: Independent but slow  TRANSFERS: Assistive device utilized: None  Sit to stand: Complete Independence Stand to sit: Complete Independence Chair to chair: Complete Independence Floor:  did not assess   STAIRS: fatigued after strength and balance testing to assess  GAIT: Gait pattern: step through pattern, shuffling, lateral lean- Right, and lateral lean- Left Distance walked: to back of clinic Assistive device utilized: None Level of assistance: Complete Independence Comments: n/a  FUNCTIONAL TESTS:  5 times sit to stand: 22.95 sec Berg Balance Scale: 47/56 Dynamic Gait Index: TBA 1/25: [place] 13/24    PATIENT SURVEYS:  FOTO not appropriate   TODAY'S TREATMENT:   [facility] Adult PT Treatment:                                                DATE: 06/10/22 Therapeutic Exercise: Standing quad and HS stretches (plinth for support)  Gastroc/soleus stretches 2x30" each Treadmill 1.5 mph x 10 min with singing songs from set list Therapeutic Activity: 3 way hip tap x 1 song L&R 3 way hip tap standing  on airex x 1 song alternate L&R Step up and over airex x 1 song Step up and back on airex x 1  song Manual Therapy: STM cervical paraspinals, cervical upglides, SO/SOR   [facility] Adult PT Treatment:                                                DATE: 06/07/2022 Therapeutic Exercise: Standing quad and HS stretches (plinth for support)  Gastroc/soleus stretches 2x30" each Heel raises 2x10  Manual Therapy: STM cervical paraspinals, cervical upglides, SO/SOR Therapeutic Activity: Treadmill: 1.5 mph, HHAx2 + singing x 66mn --> added low incline x 5101m STS with 6#MB + overhead reach in standing + singing scales 3x10   PATIENT EDUCATION: Education details: Walking longer distances + singing Person educated: Patient Education method: ExConsulting civil engineerDeMedia plannerand Handouts Education comprehension: verbalized understanding, returned demonstration, and needs further education  HOME EXERCISE PROGRAM: Access Code: [email or web address]: [email or web address] Date: 05/25/2022 Prepared by: GeEstill Bambergpril MaThurnell GarbeExercises - Supine Shoulder Flexion Extension AAROM with Dowel  - 1 x daily - 7 x weekly - 2 sets - 10 reps - Supine Scapular Retraction  - 1 x daily - 7 x weekly - 2 sets - 10 reps - 3 sec hold - Supine Chin Tuck  - 1 x daily - 7 x weekly - 2 sets - 10 reps - 3 sec hold - Supine Bridge  - 1 x daily - 7 x weekly - 2 sets - 10 reps - Clamshell  - 1 x daily - 7 x weekly - 2 sets - 10 reps   GOALS: Goals reviewed with patient? Yes  SHORT TERM GOALS: Target date: 06/23/2022   Pt will be ind with initial HEP Baseline: Goal status: INITIAL  2.  Pt will demo improved 5x STS to </=15 sec for decreased fall risk Baseline:  Goal status: INITIAL  3.  Pt will demo improved cervical ROM for lateral flexion and rotation by >/=10 deg Baseline:  Goal status: INITIAL   LONG TERM GOALS: Target date: 07/21/2022   Pt will be ind with progression and advancement of HEP Baseline:  Goal status: INITIAL  2.  Pt will have improved Berg Balance Score to >/=52/56 for decreased  fall risk Baseline:  Goal status: INITIAL  3.  Pt will improve 5x STS to </=13 sec to decrease fall risk Baseline:  Goal status: INITIAL  4.  Pt will demo enough endurance to get through a full setlist of songs for return to work Baseline:  Goal status: INITIAL  5.  Pt will report improved headache by >/=50% Baseline:  Goal status: INITIAL   ASSESSMENT:  CLINICAL IMPRESSION:  Continued dual tasking during balance, strengthening and endurance exercises. Able to perform 6 songs this session -- normally performs 10 songs in a set before taking a break. Discussed possible OT for his double vision. Discussed holding off on his TENS unit until biopsy results come back in.   OBJECTIVE IMPAIRMENTS: Abnormal gait, decreased activity tolerance, decreased balance, decreased endurance, decreased mobility, difficulty walking, decreased ROM, decreased strength, impaired sensation, impaired UE functional use, improper body mechanics, postural dysfunction, and pain.    PLAN:  PT FREQUENCY: 2x/week  PT DURATION: 8 weeks  PLANNED INTERVENTIONS: Therapeutic exercises, Therapeutic activity, Neuromuscular re-education, Balance training, Gait training, Patient/Family education, Self Care, Joint mobilization, Stair training, Vestibular training, Aquatic  Therapy, Dry Needling, Electrical stimulation, Spinal mobilization, Cryotherapy, Moist heat, Taping, Traction, Ionotophoresis '4mg'$ /ml Dexamethasone, Manual therapy, and Re-evaluation  PLAN FOR NEXT SESSION: Dual tasking (singing); Continue to work on strength/endurance/balance. Manual as needed to decrease cervical tension/headaches.  Daneil Beem April Ma L Evolette Pendell, PT 06/10/2022, 11:56 AM

## 2022-06-14 ENCOUNTER — Ambulatory Visit: Payer: BC Managed Care – PPO

## 2022-06-14 DIAGNOSIS — R2681 Unsteadiness on feet: Secondary | ICD-10-CM

## 2022-06-14 DIAGNOSIS — R2689 Other abnormalities of gait and mobility: Secondary | ICD-10-CM

## 2022-06-14 DIAGNOSIS — M6281 Muscle weakness (generalized): Secondary | ICD-10-CM | POA: Diagnosis not present

## 2022-06-14 DIAGNOSIS — R41841 Cognitive communication deficit: Secondary | ICD-10-CM | POA: Diagnosis not present

## 2022-06-14 NOTE — Therapy (Signed)
OUTPATIENT PHYSICAL THERAPY TREATMENT   Patient Name: Sean Carrillo. MRN: [id number] DOB:1965-06-18, 57 y.o., male Today's Date: 06/14/2022   PCP: Raelyn Number REFERRING PROVIDER: Emelda Brothers  END OF SESSION:  PT End of Session - 06/14/22 1530     Visit Number 7    Number of Visits 16    Date for PT Re-Evaluation 07/20/22    Authorization Type BCBS    PT Start Time 1530    PT Stop Time 1615    PT Time Calculation (min) 45 min    Activity Tolerance Patient tolerated treatment well    Behavior During Therapy WFL for tasks assessed/performed             Past Medical History:  Diagnosis Date   Brain tumor ([place])    Hyperlipidemia    Hypertension    Paralysis ([place])    Past Surgical History:  Procedure Laterality Date   APPLICATION OF CRANIAL NAVIGATION N/A 05/10/2022   Procedure: APPLICATION OF CRANIAL NAVIGATION;  Surgeon: Judith Part, MD;  Location: [place];  Service: Neurosurgery;  Laterality: N/A;   Arthroscopic knee surgery     CHOLECYSTECTOMY     CRANIOTOMY Right 05/10/2022   Procedure: Right craniotomy for tumor resection;  Surgeon: Judith Part, MD;  Location: [place];  Service: Neurosurgery;  Laterality: Right;  RM 21   Patient Active Problem List   Diagnosis Date Noted   Bradycardia 05/13/2022   Brain tumor ([place]) 05/10/2022   Brain mass 03/09/2021   PVFS (postviral fatigue syndrome) 02/10/2021   Syncope 02/10/2021   Chronic insomnia 02/10/2021   Palpitations 04/10/2020   Essential hypertension 04/10/2020   Tear of lateral cartilage or meniscus of knee, current 12/16/2011    ONSET DATE: ~2 weeks ago  REFERRING DIAG: G93.89 (ICD-10-CM) - Brain mass   THERAPY DIAG:  Muscle weakness (generalized)  Unsteadiness on feet  Other abnormalities of gait and mobility  Cognitive communication deficit  Rationale for Evaluation and Treatment: Rehabilitation  SUBJECTIVE:                                                                                                                                                                                              SUBJECTIVE STATEMENT: Patient reports he has appointment with neurologist on Wednesday and hopes to get biopsy results. Patient states he has less headaches and continued double vision, however vision is improving.   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 Yes:  NPRS scale: 4.5/10 Pain location: Front temple into behind R ear Pain description: Throbbing Aggravating factors: light Relieving factors: nothing thus far  PRECAUTIONS: None  WEIGHT BEARING RESTRICTIONS: No  FALLS: Has patient fallen in last 6 months? 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 Postop right temporal craniotomy for biopsy. 2. 3 mm left hemispheric subdural hematoma. Mild subdural hematoma along the posterior falx and bilateral tentorium. 3. Lobular extra-axial soft tissue mass in the basilar cisterns appears similar to the prior MRI. 4. These results were called by telephone at the time of interpretation on 05/11/2022 at 5:12 pm to provider Ostergard , who verbally acknowledged these results.  COGNITION: Overall cognitive status:  reports impaired memory and cognition   SENSATION: WFL "leg feels like rubber"  COORDINATION: Slow with all movements  EDEMA:  None  MUSCLE TONE: None noted  MUSCLE LENGTH: Did not  assess  DTRs: Did not assess  POSTURE: Holds head in flexed position, rounded shoulders/thoracic spine due to pain/fatigue  CERVICAL ROM:   Active ROM A/PROM (deg) eval  Flexion 60  Extension 20*  Right lateral flexion 40  Left lateral flexion 30  Right rotation 40  Left rotation 45   (Blank rows = not tested)  * = pain Feels like head drops to the right  LOWER EXTREMITY ROM:   Grossly WFL  LOWER EXTREMITY MMT:    MMT Right Eval Left Eval  Hip flexion 3+ 4  Hip extension 4- 4-  Hip abduction 3+ 4-  Hip adduction    Hip internal rotation    Hip external rotation    Knee flexion 4 4  Knee extension 4 4  Ankle dorsiflexion    Ankle plantarflexion    Ankle inversion    Ankle eversion    (Blank rows = not tested)  UPPER EXTREMITY MMT: grossly 3+/5 bilat  BED MOBILITY: Independent but slow  TRANSFERS: Assistive device utilized: None  Sit to stand: Complete Independence Stand to sit: Complete Independence Chair to chair: Complete Independence Floor:  did not assess   STAIRS: fatigued after strength and balance testing to assess  GAIT: Gait pattern: step through pattern, shuffling, lateral lean- Right, and lateral lean- Left Distance walked: to back of clinic Assistive device utilized: None Level of assistance: Complete Independence Comments: n/a  FUNCTIONAL TESTS:  5 times sit to stand: 22.95 sec Berg Balance Scale: 47/56 Dynamic Gait Index: TBA 1/25: [place] 13/24    PATIENT SURVEYS:  FOTO not appropriate   TODAY'S TREATMENT:    [facility] Adult PT Treatment:                                                DATE: 06/14/2022 Therapeutic Exercise: LE stretches: standing quad stretch, alt side lunges, dynamic HS stretch Manual Therapy: STM cervical paraspinals, cervical upglides, SO/SOR R UT pin & stretch Therapeutic Activity: Treadmill (2.2 mph) + singing (youtube karoke) x10 min 6" step ups + singing x 1 song  [place] x 1 song Standing in airex:  bounce/toss basketball with therapist x 1 song SLB x30" B --> SLB rebounder with 2#MB toss/catch   [facility] Adult PT Treatment:                                                DATE: 06/10/22 Therapeutic Exercise: Standing quad and HS stretches (plinth for support)  Gastroc/soleus stretches 2x30" each Treadmill 1.5 mph x 10 min with singing songs from set list Therapeutic Activity: 3 way hip tap x 1 song L&R 3 way hip tap standing on airex x 1 song alternate L&R Step up and over airex x 1 song Step up and back on airex x 1 song Manual Therapy: STM cervical paraspinals, cervical upglides, SO/SOR   PATIENT EDUCATION: Education details: Dual tasking (singing/endurance) Person educated: Patient Education method: Explanation, Demonstration, and Handouts Education comprehension: verbalized understanding, returned demonstration, and needs further education  HOME EXERCISE PROGRAM: Access Code: [place] URL: [email or web address] Date: 05/25/2022 Prepared by: Estill Bamberg April Thurnell Garbe  Exercises - Supine Shoulder Flexion Extension AAROM with Dowel  - 1 x daily - 7 x weekly - 2 sets - 10 reps - Supine Scapular Retraction  - 1 x daily - 7 x weekly - 2 sets - 10 reps - 3 sec hold - Supine Chin Tuck  - 1 x daily - 7 x weekly - 2 sets - 10 reps - 3 sec hold - Supine Bridge  - 1 x daily - 7 x weekly - 2 sets - 10 reps - Clamshell  - 1 x daily - 7 x weekly - 2 sets - 10 reps   GOALS: Goals reviewed with patient? Yes  SHORT TERM GOALS: Target date: 06/23/2022  Pt will be ind with initial HEP Baseline: Goal status: INITIAL  2.  Pt will demo improved 5x STS to </=15 sec for decreased fall risk Baseline:  Goal status: INITIAL  3.  Pt will demo improved cervical ROM for lateral flexion and rotation by >/=10 deg Baseline:  Goal status: INITIAL   LONG TERM GOALS: Target date: 07/21/2022  Pt will be ind with progression and advancement of HEP Baseline:  Goal status:  INITIAL  2.  Pt will have improved Berg Balance Score to >/=52/56 for decreased fall risk Baseline:  Goal status: INITIAL  3.  Pt will improve 5x STS to </=13 sec to decrease fall risk Baseline:  Goal status: INITIAL  4.  Pt will demo enough endurance to get through a full setlist of songs for return to work Baseline:  Goal status: INITIAL  5.  Pt will report improved headache by >/=50% Baseline:  Goal status: INITIAL   ASSESSMENT:  CLINICAL IMPRESSION:  Continued dual tasking (singing and walking on treadmill + harmonizing with music track); patient demonstrate improvement with endurance, tolerating increased movement episodes while singing. God ankle strategy and dynamic balance exhibited during progression to single leg balance with ball toss/catch on rebounder; occasional use of hip strategy demonstrated to maintain balance.   OBJECTIVE IMPAIRMENTS: Abnormal gait, decreased activity tolerance, decreased balance, decreased endurance, decreased mobility, difficulty walking, decreased ROM, decreased strength, impaired sensation, impaired UE functional use, improper body mechanics, postural dysfunction, and pain.    PLAN:  PT FREQUENCY: 2x/week  PT DURATION: 8 weeks  PLANNED INTERVENTIONS: Therapeutic exercises, Therapeutic activity, Neuromuscular re-education, Balance training, Gait training, Patient/Family education,  Self Care, Joint mobilization, Stair training, Vestibular training, Aquatic Therapy, Dry Needling, Electrical stimulation, Spinal mobilization, Cryotherapy, Moist heat, Taping, Traction, Ionotophoresis 9m/ml Dexamethasone, Manual therapy, and Re-evaluation  PLAN FOR NEXT SESSION: Dual tasking (singing); Continue to work on strength/endurance/balance. Manual as needed to decrease cervical tension/headaches.  KHardin Negus PTA 06/14/2022, 4:21 PM

## 2022-06-16 ENCOUNTER — Ambulatory Visit: Payer: BC Managed Care – PPO

## 2022-06-16 ENCOUNTER — Other Ambulatory Visit: Payer: Self-pay | Admitting: Radiation Therapy

## 2022-06-16 DIAGNOSIS — R41841 Cognitive communication deficit: Secondary | ICD-10-CM | POA: Diagnosis not present

## 2022-06-16 NOTE — Therapy (Signed)
OUTPATIENT SPEECH LANGUAGE PATHOLOGY TREATMENT   Patient Name: Sean Carrillo. MRN: [id number] DOB:[birth year], 57 y.o., male Today's Date: 06/16/2022  PCP: Trey Sailors PA REFERRING PROVIDER: Judith Part, MD  END OF SESSION:  End of Session - 06/16/22 1501     Visit Number 3    Number of Visits 17    Date for SLP Re-Evaluation 07/29/22    SLP Start Time 1450    SLP Stop Time  [id number]    SLP Time Calculation (min) 40 min    Activity Tolerance Patient tolerated treatment well             Past Medical History:  Diagnosis Date   Brain tumor ([place])    Hyperlipidemia    Hypertension    Paralysis ([place])    Past Surgical History:  Procedure Laterality Date   APPLICATION OF CRANIAL NAVIGATION N/A 05/10/2022   Procedure: APPLICATION OF CRANIAL NAVIGATION;  Surgeon: Judith Part, MD;  Location: [facility];  Service: Neurosurgery;  Laterality: N/A;   Arthroscopic knee surgery     CHOLECYSTECTOMY     CRANIOTOMY Right 05/10/2022   Procedure: Right craniotomy for tumor resection;  Surgeon: Judith Part, MD;  Location: [facility];  Service: Neurosurgery;  Laterality: Right;  RM 21   Patient Active Problem List   Diagnosis Date Noted   Bradycardia 05/13/2022   Brain tumor ([place]) 05/10/2022   Brain mass 03/09/2021   PVFS (postviral fatigue syndrome) 02/10/2021   Syncope 02/10/2021   Chronic insomnia 02/10/2021   Palpitations 04/10/2020   Essential hypertension 04/10/2020   Tear of lateral cartilage or meniscus of knee, current 12/16/2011    ONSET DATE: 05-10-22   REFERRING DIAG: D49.6 (ICD-10-CM) - Neoplasm of unspecified behavior of brain  THERAPY DIAG:  Cognitive communication deficit  Rationale for Evaluation and Treatment: Rehabilitation  SUBJECTIVE:   SUBJECTIVE STATEMENT: "I'm starting to look more and more like myself again." Pt accompanied by: self  PERTINENT HISTORY: Patient was admitted 1/8 to 05/13/22 following an uncomplicated right  sided craniotomy for biopsy and 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 Brain surgery 2-3 weeks ago. Pain has been bad since. Has tried pain medicine but has not had a good response. Pt notes increased weakness. Balance has been off as noted by wife. Wife reports pt has not been eating as much. Pt notes R eye is not the best right now. Pt reports difficulty with recall -- worried as this may affect his work.  PAIN:  Are you having pain? No  PATIENT GOALS: "I want to perform again."  OBJECTIVE:  Today's Treatment 06/16/22: Pt relates he is using  memory compensation of association for meds, his phone for calendar/appointments, and to do lists. Pt has been continuing to practice with You Tube videos to rehearse movement (for stamina) and song transitions and order. During session pt experienced anomia with "[place]" - SLP cued pt to generate 5 sentences for every word with which he experiences anomia, and SLP wrote LTG for this.  06/09/22: SLP addressed songs pt did not recall details about during the rehearsal, and encouraged him to practice these songs - pt did not write down which songs were problematic so SLP provided pt with pencil/paper and he wrote them down. 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 SLP then talked with pt about med administration - he put alarm  in his phone and generated a plan to take his meds in the morning indpendently. Pt spontaneously wrote down he needs to tell wife he is taking over meds and needs her to ensure proper administration for 4-5 days, and to inquire of her if she has seen any other areas that are deficient.  Pt is independent with bill pay (via phone) without skipping/missing/late payments, and tracking appointments (via phone) independently.   PATIENT EDUCATION: Education details: Pt was told to watch old recordings of shows and take notes on the details and also to sing with  the shows with his notes, to foster recall of details and sequence. Person educated: Patient Education method: Explanation and Handouts Education comprehension: verbalized understanding   GOALS: Goals reviewed with patient? No  SHORT TERM GOALS: Target date: 06/30/22  Pt will bring memory system to 60% of sessions Baseline: 2/2 Goal status: Ongoing  2.  Pt will demo use/report use of memory system with extended time allowed, in/between 4 sessions Baseline: 06/09/22, 06/16/22 Goal status: Ongoing  3.  Pt will undergo further cognitive linguistic testing Baseline:  Goal status: Deferred    LONG TERM GOALS: Target date: 07/29/22  Pt will score higher/better on PROM than initial administration Baseline:  Goal status: Ongoing  2.  Pt will complete medication administration tasks with modified independence over two weeks Baseline: 06/16/22 Goal status: Ongoing  3.  Pt will utilize memory strategies/system successfully over 3 weeks Baseline: 06/16/22 Goal status: Ongoing  4.  Pt will report use of linguistic facilitation strategy (e.g., say 5 sentences using word he experienced with anomia, SFA, etc) was successful for subsequent attempts finding that word, x2 sessions Baseline:  Goal status: INITIAL  ASSESSMENT:  CLINICAL IMPRESSION: Patient is a 57 y.o. male who was seen today for treatment of cognitive linguistics/memory. He is consistently using memory strategies for both home and vocational situations, and is details of songs cont to return. His first performance post-sx is in two weeks. He needs recall to perform tasks both at work and at home.   OBJECTIVE IMPAIRMENTS: include attention and memory. These impairments are limiting patient from return to work, managing medications, managing appointments, managing finances, household responsibilities, ADLs/IADLs, and effectively communicating at home and in community. Factors affecting potential to achieve goals and functional  outcome are co-morbidities and severity of impairments.. Patient will benefit from skilled SLP services to address above impairments and improve overall function.  REHAB POTENTIAL: Good  PLAN:  SLP FREQUENCY: 2x/week  SLP DURATION: 8 weeks  PLANNED INTERVENTIONS: Environmental controls, Cueing hierachy, Cognitive reorganization, Internal/external aids, Functional tasks, SLP instruction and feedback, Compensatory strategies, and Patient/family education    [facility], [place] 06/16/2022, 3:02 PM

## 2022-06-17 ENCOUNTER — Ambulatory Visit: Payer: BC Managed Care – PPO

## 2022-06-17 DIAGNOSIS — R2689 Other abnormalities of gait and mobility: Secondary | ICD-10-CM | POA: Diagnosis not present

## 2022-06-17 DIAGNOSIS — R41841 Cognitive communication deficit: Secondary | ICD-10-CM | POA: Diagnosis not present

## 2022-06-17 DIAGNOSIS — M6281 Muscle weakness (generalized): Secondary | ICD-10-CM | POA: Diagnosis not present

## 2022-06-17 DIAGNOSIS — R2681 Unsteadiness on feet: Secondary | ICD-10-CM

## 2022-06-17 NOTE — Therapy (Signed)
OUTPATIENT PHYSICAL THERAPY TREATMENT   Patient Name: Sean Carrillo. MRN: [id number] DOB:Nov 11, 1965, 57 y.o., male Today's Date: 06/17/2022   PCP: Raelyn Number REFERRING PROVIDER: Emelda Brothers  END OF SESSION:  PT End of Session - 06/17/22 1147     Visit Number 8    Number of Visits 16    Date for PT Re-Evaluation 07/20/22    Authorization Type BC[employer]    PT Start Time 1148    PT Stop Time 1230    PT Time Calculation (min) 42 min    Activity Tolerance Patient tolerated treatment well    Behavior During Therapy WFL for tasks assessed/performed             Past Medical History:  Diagnosis Date   Brain tumor ([place])    Hyperlipidemia    Hypertension    Par[place] ([place])    Past Surgical History:  Procedure Laterality Date   APPLICATION OF CRANIAL NAVIGATION N/A 05/10/2022   Procedure: APPLICATION OF CRANIAL NAVIGATION;  Surgeon: Judith Part, MD;  Location: [facility];  Service: Neurosurgery;  Laterality: N/A;   Arthroscopic knee surgery     CHOLECYSTECTOMY     CRANIOTOMY Right 05/10/2022   Procedure: Right craniotomy for tumor resection;  Surgeon: Judith Part, MD;  Location: [facility];  Service: Neurosurgery;  Laterality: Right;  RM 21   Patient Active Problem List   Diagnosis Date Noted   Bradycardia 05/13/2022   Brain tumor ([place]) 05/10/2022   Brain mass 03/09/2021   PVFS (postviral fatigue syndrome) 02/10/2021   Syncope 02/10/2021   Chronic insomnia 02/10/2021   Palpitations 04/10/2020   Essential hypertension 04/10/2020   Tear of lateral cartilage or meniscus of knee, current 12/16/2011    ONSET DATE: ~2 weeks ago  REFERRING DIAG: G93.89 (ICD-10-CM) - Brain mass   THERAPY DIAG:  Cognitive communication deficit  Muscle weakness (generalized)  Unsteadiness on feet  Other abnormalities of gait and mobility  Rationale for Evaluation and Treatment: Rehabilitation  SUBJECTIVE:                                                                                                                                                                                              SUBJECTIVE STATEMENT: Patient reports he was driving at night after last PT visit where he was talking and driving when he began to feel like he was going to pass out; patient states he was able to alleviate symptoms by stopping talking and focusing on breathing. Patient states he has been headache-free for five days.    PERTINENT HISTORY: Patient was admitted 1/8 to 05/13/22 following  an uncomplicated right sided craniotomy for biopsy and partial resection of a midline skull base mass   PAIN:  Are you having pain? Yes: NPRS scale: 4.5/10 Pain location: Front temple into behind R ear Pain description: Throbbing Aggravating factors: light Relieving factors: nothing thus far  PRECAUTIONS: None  WEIGHT BEARING RESTRICTIONS: No  FALLS: Has patient fallen in last 6 months? No  LIVING ENVIRONMENT: Lives with: lives with their spouse, [name] Lives in: House/apartment Stairs: Yes: Internal: 16 steps; on right going up and External: 2 steps; none Has following equipment at home: None  PLOF: Independent Pt sings for work Teacher, English as a foreign language) -- next gig is supposed to be next week but they had to cancel  PATIENT GOALS: Improve strength and cognition to return to work  OBJECTIVE:   DIAGNOSTIC FINDINGS: [facility] 05/11/22 IMPRESSION: 1. Postop right temporal craniotomy for biopsy. 2. 3 mm left hemispheric subdural hematoma. Mild subdural hematoma along the posterior falx and bilateral tentorium. 3. Lobular extra-axial soft tissue mass in the basilar cisterns appears similar to the prior MRI. 4. These results were called by telephone at the time of interpretation on 05/11/2022 at 5:12 pm to provider Ostergard , who verbally acknowledged these results.  COGNITION: Overall cognitive status:  reports impaired memory and cognition   SENSATION: WFL "leg feels like  rubber"  COORDINATION: Slow with all movements  EDEMA:  None  MUSCLE TONE: None noted  MUSCLE LENGTH: Did not assess  DTRs: Did not assess  POSTURE: Holds head in flexed position, rounded shoulders/thoracic spine due to pain/fatigue  CERVICAL ROM:   Active ROM A/PROM (deg) eval  Flexion 60  Extension 20*  Right lateral flexion 40  Left lateral flexion 30  Right rotation 40  Left rotation 45   (Blank rows = not tested)  * = pain Feels like head drops to the right  LOWER EXTREMITY ROM:   Grossly WFL  LOWER EXTREMITY MMT:    MMT Right Eval Left Eval  Hip flexion 3+ 4  Hip extension 4- 4-  Hip abduction 3+ 4-  Hip adduction    Hip internal rotation    Hip external rotation    Knee flexion 4 4  Knee extension 4 4  Ankle dorsiflexion    Ankle plantarflexion    Ankle inversion    Ankle eversion    (Blank rows = not tested)  UPPER EXTREMITY MMT: grossly 3+/5 bilat  BED MOBILITY: Independent but slow  TRANSFERS: Assistive device utilized: None  Sit to stand: Complete Independence Stand to sit: Complete Independence Chair to chair: Complete Independence Floor:  did not assess   STAIRS: fatigued after strength and balance testing to assess  GAIT: Gait pattern: step through pattern, shuffling, lateral lean- Right, and lateral lean- Left Distance walked: to back of clinic Assistive device utilized: None Level of assistance: Complete Independence Comments: n/a  FUNCTIONAL TESTS:  5 times sit to stand: 22.95 sec Berg Balance Scale: 47/56 Dynamic Gait Index: TBA 1/25: [place] 13/24    PATIENT SURVEYS:  FOTO not appropriate   TODAY'S TREATMENT:    [facility] Adult PT Treatment:                                                DATE: 06/17/2022 Therapeutic Exercise: LE stretches: standing quad stretch, alt side lunges, dynamic HS stretch + subjective intake Thoracic mobility: seated extension with  coregeous ball --> alternating with flexion Seated SB roll out  5x5" Therapeutic Activity: Treadmill: 2.2 mph, incline 0%-2%) + singing (youtube karoke) x10 min Stair step-up choreography + singing x 1 song  Turning grapevine + singing x 1 song    [facility] Adult PT Treatment:                                                DATE: 06/14/2022 Therapeutic Exercise: LE stretches: standing quad stretch, alt side lunges, dynamic HS stretch Manual Therapy: STM cervical paraspinals, cervical upglides, SO/SOR R UT pin & stretch Therapeutic Activity: Treadmill (2.2 -2.4 mph) + singing (youtube karoke) x10 min 6" step ups + singing x 1 song  Grapevine x 1 song Standing in airex: bounce/toss basketball with therapist x 1 song SLB x30" B --> SLB rebounder with 2#MB toss/catch   PATIENT EDUCATION: Education details: Dual tasking (singing/endurance) Person educated: Patient Education method: Explanation, Demonstration, and Handouts Education comprehension: verbalized understanding, returned demonstration, and needs further education  HOME EXERCISE PROGRAM: Access Code: [place] URL: [email or web address] Date: 05/25/2022 Prepared by: Estill Bamberg April Thurnell Garbe  Exercises - Supine Shoulder Flexion Extension AAROM with [place]  - 1 x daily - 7 x weekly - 2 sets - 10 reps - Supine Scapular Retraction  - 1 x daily - 7 x weekly - 2 sets - 10 reps - 3 sec hold - Supine Chin Tuck  - 1 x daily - 7 x weekly - 2 sets - 10 reps - 3 sec hold - Supine Bridge  - 1 x daily - 7 x weekly - 2 sets - 10 reps - Clamshell  - 1 x daily - 7 x weekly - 2 sets - 10 reps   GOALS: Goals reviewed with patient? Yes  SHORT TERM GOALS: Target date: 06/23/2022  Pt will be ind with initial HEP Baseline: Goal status: INITIAL  2.  Pt will demo improved 5x STS to </=15 sec for decreased fall risk Baseline:  Goal status: INITIAL  3.  Pt will demo improved cervical ROM for lateral flexion and rotation by >/=10 deg Baseline:  Goal status: INITIAL   LONG TERM GOALS:  Target date: 07/21/2022  Pt will be ind with progression and advancement of HEP Baseline:  Goal status: INITIAL  2.  Pt will have improved Berg Balance Score to >/=52/56 for decreased fall risk Baseline:  Goal status: INITIAL  3.  Pt will improve 5x STS to </=13 sec to decrease fall risk Baseline:  Goal status: INITIAL  4.  Pt will demo enough endurance to get through a full setlist of songs for return to work Baseline:  Goal status: INITIAL  5.  Pt will report improved headache by >/=50% Baseline:  Goal status: INITIAL   ASSESSMENT:  CLINICAL IMPRESSION:  Patient demonstrated good progression with tolerance and endurance of prolonged physical activity while singing; mild incline added on treadmill to challenge endurance. Dual tasking progressed with more complex choreography during step ups while singing; directional changes added with grapevine variations to challenge dynamic balance and postural stability.    OBJECTIVE IMPAIRMENTS: Abnormal gait, decreased activity tolerance, decreased balance, decreased endurance, decreased mobility, difficulty walking, decreased ROM, decreased strength, impaired sensation, impaired UE functional use, improper body mechanics, postural dysfunction, and pain.    PLAN:  PT FREQUENCY: 2x/week  PT DURATION: 8 weeks  PLANNED INTERVENTIONS: Therapeutic  exercises, Therapeutic activity, Neuromuscular re-education, Balance training, Gait training, Patient/Family education, Self Care, Joint mobilization, Stair training, Vestibular training, Aquatic Therapy, Dry Needling, Electrical stimulation, Spinal mobilization, Cryotherapy, Moist heat, Taping, Traction, Ionotophoresis 62m/ml Dexamethasone, Manual therapy, and Re-evaluation  PLAN FOR NEXT SESSION: Dual tasking (singing); Continue to work on strength/endurance/balance. Manual as needed to decrease cervical tension/headaches.  KHardin Negus PTA 06/17/2022, 12:33 PM

## 2022-06-21 ENCOUNTER — Inpatient Hospital Stay: Payer: BC Managed Care – PPO | Attending: Neurological Surgery

## 2022-06-21 ENCOUNTER — Ambulatory Visit: Payer: BC Managed Care – PPO

## 2022-06-21 DIAGNOSIS — R41841 Cognitive communication deficit: Secondary | ICD-10-CM

## 2022-06-21 DIAGNOSIS — R2689 Other abnormalities of gait and mobility: Secondary | ICD-10-CM | POA: Diagnosis not present

## 2022-06-21 DIAGNOSIS — R2681 Unsteadiness on feet: Secondary | ICD-10-CM

## 2022-06-21 DIAGNOSIS — M6281 Muscle weakness (generalized): Secondary | ICD-10-CM

## 2022-06-21 NOTE — Therapy (Signed)
OUTPATIENT PHYSICAL THERAPY TREATMENT   Patient Name: Sean Carrillo. MRN: [id number] DOB:06-Aug-1965, 57 y.o., male Today's Date: 06/21/2022   PCP: Raelyn Number REFERRING PROVIDER: Emelda Brothers  END OF SESSION:  PT End of Session - 06/21/22 1016     Visit Number 9    Number of Visits 16    Date for PT Re-Evaluation 07/20/22    Authorization Type BCBS    PT Start Time 1015    PT Stop Time 1102    PT Time Calculation (min) 47 min    Activity Tolerance Patient tolerated treatment well    Behavior During Therapy WFL for tasks assessed/performed             Past Medical History:  Diagnosis Date   Brain tumor ([place])    Hyperlipidemia    Hypertension    Paralysis ([place])    Past Surgical History:  Procedure Laterality Date   APPLICATION OF CRANIAL NAVIGATION N/A 05/10/2022   Procedure: APPLICATION OF CRANIAL NAVIGATION;  Surgeon: Judith Part, MD;  Location: [facility];  Service: Neurosurgery;  Laterality: N/A;   Arthroscopic knee surgery     CHOLECYSTECTOMY     CRANIOTOMY Right 05/10/2022   Procedure: Right craniotomy for tumor resection;  Surgeon: Judith Part, MD;  Location: [facility];  Service: Neurosurgery;  Laterality: Right;  RM 21   Patient Active Problem List   Diagnosis Date Noted   Bradycardia 05/13/2022   Brain tumor ([place]) 05/10/2022   Brain mass 03/09/2021   PVFS (postviral fatigue syndrome) 02/10/2021   Syncope 02/10/2021   Chronic insomnia 02/10/2021   Palpitations 04/10/2020   Essential hypertension 04/10/2020   Tear of lateral cartilage or meniscus of knee, current 12/16/2011    ONSET DATE: ~2 weeks ago  REFERRING DIAG: G93.89 (ICD-10-CM) - Brain mass   THERAPY DIAG:  Cognitive communication deficit  Muscle weakness (generalized)  Unsteadiness on feet  Other abnormalities of gait and mobility  Rationale for Evaluation and Treatment: Rehabilitation  SUBJECTIVE:                                                                                                                                                                                              SUBJECTIVE STATEMENT: Patient reports he he has blurry vision today, states it usually clears up by mid-morning but today it is lingering. Patient reports no headaches today.    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 Yes: NPRS scale: 4.5/10  Pain location: Front temple into behind R ear Pain description: Throbbing Aggravating factors: light Relieving factors: nothing thus far  PRECAUTIONS: None  WEIGHT BEARING RESTRICTIONS: No  FALLS: Has patient fallen in last 6 months? No  LIVING ENVIRONMENT: Lives with: lives with their spouse, [name] Lives in: House/apartment Stairs: Yes: Internal: 16 steps; on right going up and External: 2 steps; none Has following equipment at home: None  PLOF: Independent Pt sings for work Teacher, English as a foreign language) -- next gig is supposed to be next week but they had to cancel  PATIENT GOALS: Improve strength and cognition to return to work  OBJECTIVE:   DIAGNOSTIC FINDINGS: [facility] 05/11/22 IMPRESSION: 1. Postop right temporal craniotomy for biopsy. 2. 3 mm left hemispheric subdural hematoma. Mild subdural hematoma along the posterior falx and bilateral tentorium. 3. Lobular extra-axial soft tissue mass in the basilar cisterns appears similar to the prior MRI. 4. These results were called by telephone at the time of interpretation on 05/11/2022 at 5:12 pm to provider Ostergard , who verbally acknowledged these results.  COGNITION: Overall cognitive status:  reports impaired memory and cognition   SENSATION: WFL "leg feels like rubber"  COORDINATION: Slow with all movements  EDEMA:  None  MUSCLE TONE: None noted  MUSCLE LENGTH: Did not assess  DTRs: Did not  assess  POSTURE: Holds head in flexed position, rounded shoulders/thoracic spine due to pain/fatigue  CERVICAL ROM:   Active ROM A/PROM (deg) eval  Flexion 60  Extension 20*  Right lateral flexion 40  Left lateral flexion 30  Right rotation 40  Left rotation 45   (Blank rows = not tested)  * = pain Feels like head drops to the right  LOWER EXTREMITY ROM:   Grossly WFL  LOWER EXTREMITY MMT:    MMT Right Eval Left Eval  Hip flexion 3+ 4  Hip extension 4- 4-  Hip abduction 3+ 4-  Hip adduction    Hip internal rotation    Hip external rotation    Knee flexion 4 4  Knee extension 4 4  Ankle dorsiflexion    Ankle plantarflexion    Ankle inversion    Ankle eversion    (Blank rows = not tested)  UPPER EXTREMITY MMT: grossly 3+/5 bilat  BED MOBILITY: Independent but slow  TRANSFERS: Assistive device utilized: None  Sit to stand: Complete Independence Stand to sit: Complete Independence Chair to chair: Complete Independence Floor:  did not assess   STAIRS: fatigued after strength and balance testing to assess  GAIT: Gait pattern: step through pattern, shuffling, lateral lean- Right, and lateral lean- Left Distance walked: to back of clinic Assistive device utilized: None Level of assistance: Complete Independence Comments: n/a  FUNCTIONAL TESTS:  5 times sit to stand: 22.95 sec Berg Balance Scale: 47/56 Dynamic Gait Index: TBA 1/25: [place] 13/24    PATIENT SURVEYS:  FOTO not appropriate   TODAY'S TREATMENT:    [facility] Adult PT Treatment:                                                DATE: 06/21/2022 Therapeutic Activity: Treadmill warm-up (1.9 mph, incline 0%-3%) + subjective intake --> naming cities in alphabetical order --> band name game Seated VORx1 yaw plane 5x30" Pencil push-ups  Fwd amb VORx1 yaw plane  [place] yaw plane standing -->walking    [facility] Adult PT Treatment:  DATE:  06/17/2022 Therapeutic Exercise: LE stretches: standing quad stretch, alt side lunges, dynamic HS stretch + subjective intake Thoracic mobility: seated extension with coregeous ball --> alternating with flexion Seated SB roll out 5x5" Therapeutic Activity: Treadmill: 2.2 mph, incline 0%-2%) + singing (youtube karoke) x10 min Stair step-up choreography + singing x 1 song  Turning grapevine + singing x 1 song   PATIENT EDUCATION: Education details: Dual tasking (singing/endurance) Person educated: Patient Education method: Explanation, Demonstration, and Handouts Education comprehension: verbalized understanding, returned demonstration, and needs further education  HOME EXERCISE PROGRAM: Access Code: [place] URL: [email or web address] Date: 06/21/2022 Prepared by: Helane Gunther  Exercises - Supine Shoulder Flexion Extension AAROM with [place]el  - 1 x daily - 7 x weekly - 2 sets - 10 reps - Supine Scapular Retraction  - 1 x daily - 7 x weekly - 2 sets - 10 reps - 3 sec hold - Supine Chin Tuck  - 1 x daily - 7 x weekly - 2 sets - 10 reps - 3 sec hold - Supine Bridge  - 1 x daily - 7 x weekly - 2 sets - 10 reps - Clamshell  - 1 x daily - 7 x weekly - 2 sets - 10 reps - Seated Gaze Stabilization with Head Rotation  - 1 x daily - 7 x weekly - 3 sets - 10 reps - Seated Gaze Stabilization with Head Nod  - 3 x daily - 7 x weekly - 3 sets - 10 reps - Seated Gaze Stabilization with Head Nod and Vertical Arm Movement  - 3 x daily - 7 x weekly - 3 sets - 10 reps - Narrow Stance Gaze Stabilization with Two Near Targets and Head Rotation  - 3 x daily - 7 x weekly - 3 sets - 10 reps   GOALS: Goals reviewed with patient? Yes  SHORT TERM GOALS: Target date: 06/23/2022  Pt will be ind with initial HEP Baseline: Goal status: INITIAL  2.  Pt will demo improved 5x STS to </=15 sec for decreased fall risk Baseline:  Goal status: INITIAL  3.  Pt will demo improved cervical ROM for  lateral flexion and rotation by >/=10 deg Baseline:  Goal status: INITIAL   LONG TERM GOALS: Target date: 07/21/2022  Pt will be ind with progression and advancement of HEP Baseline:  Goal status: INITIAL  2.  Pt will have improved Berg Balance Score to >/=52/56 for decreased fall risk Baseline:  Goal status: INITIAL  3.  Pt will improve 5x STS to </=13 sec to decrease fall risk Baseline:  Goal status: INITIAL  4.  Pt will demo enough endurance to get through a full setlist of songs for return to work Baseline:  Goal status: INITIAL  5.  Pt will report improved headache by >/=50% Baseline:  Goal status: INITIAL   ASSESSMENT:  CLINICAL IMPRESSION:  Prolonged time performed on treadmill to progress endurance in preparation for upcoming concert; patient reported moderate fatigue after 18 minutes with slight increase in blurry vision. VORx1 and saccade exercises performed to improve gaze stabilization and address vestibular deficits. Patient reported improvement in gaze stability after performing VORx1 repetitions.     OBJECTIVE IMPAIRMENTS: Abnormal gait, decreased activity tolerance, decreased balance, decreased endurance, decreased mobility, difficulty walking, decreased ROM, decreased strength, impaired sensation, impaired UE functional use, improper body mechanics, postural dysfunction, and pain.    PLAN:  PT FREQUENCY: 2x/week  PT DURATION: 8 weeks  PLANNED INTERVENTIONS: Therapeutic exercises, Therapeutic activity, Neuromuscular re-education,  Balance training, Gait training, Patient/Family education, Self Care, Joint mobilization, Stair training, Vestibular training, Aquatic Therapy, Dry Needling, Electrical stimulation, Spinal mobilization, Cryotherapy, Moist heat, Taping, Traction, Ionotophoresis 15m/ml Dexamethasone, Manual therapy, and Re-evaluation  PLAN FOR NEXT SESSION: Trial brock string & stroop card. Continues VORx1, VORc. Dual tasking (singing); Continue to  work on strength/endurance/balance. Manual as needed to decrease cervical tension/headaches.  KHardin Negus PTA 06/21/2022, 11:04 AM

## 2022-06-22 ENCOUNTER — Ambulatory Visit: Payer: BC Managed Care – PPO

## 2022-06-22 DIAGNOSIS — R41841 Cognitive communication deficit: Secondary | ICD-10-CM

## 2022-06-22 NOTE — Progress Notes (Signed)
Cardiology Clinic Note   Patient Name: Sean Carrillo. Date of Encounter: 06/25/2022  Primary Care Provider:  Trey Sailors, PA Primary Cardiologist:  Sean Ruths, MD  Patient Profile    57 y.o. male with a hx of palpitations, HTN, sinus bradycardia, and brain mass who is being seen 05/13/2022 for the evaluation of syncope and bradycardia by cardiology. Heart monitor at that time showed sinus rhythm and no significant arrhythmias, AV block, or pauses. Echo 2021 with normal BiV functio and no significant valvular disease. Coreg started and helped with palpitations. He was seen back after a syncopal episode in 2022. Repeat echo 2022 stable. Head CTA with irregular density in the parasellar region and prepontine cistern. Had craniotomy on 05/10/22. Following surgery, HR on telemetry noted in the 30s. It was thought to be vagal after a bout of coughing. Coreg and propanolol was discontinued and a two week Zio monitor was placed. He is undergoing PT post surgery.   Past Medical History    Past Medical History:  Diagnosis Date   Brain tumor ([place])    Hyperlipidemia    Hypertension    Paralysis ([place])    Past Surgical History:  Procedure Laterality Date   APPLICATION OF CRANIAL NAVIGATION N/A 05/10/2022   Procedure: APPLICATION OF CRANIAL NAVIGATION;  Surgeon: Sean Part, MD;  Location: [facility];  Service: Neurosurgery;  Laterality: N/A;   Arthroscopic knee surgery     CHOLECYSTECTOMY     CRANIOTOMY Right 05/10/2022   Procedure: Right craniotomy for tumor resection;  Surgeon: Sean Part, MD;  Location: [facility];  Service: Neurosurgery;  Laterality: Right;  RM 21    Allergies  No Known Allergies  History of Present Illness   Sean Carrillo returns today post hospitalization after a right sided craniotomy biopsy and 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 Zada Carrillo. On 05/10/2022. He was found to have intermittent bradycardia with coughing and once with  sleeping. HR was in the 30's. Telemetry showed brief junctional rhythm.  He was taken off of Coreg and Propanolol with recommendations to start amlodipine for BP control if necessary. He was to have a sleep study but this was denied by insurance. A Zio monitor was placed for two weeks. Monitor did not find any significant abnormal rhythms, pauses or heart block.   Sean Carrillo comes today with his wife.  He is still having episodes of rapid heart rhythm at rest.  The patient states that he has no information from his surgeon concerning the tumor that was removed.  He has been taking carvedilol as needed for rapid heart rate, (Coreg 6.25 mg) a couple of tablets when this occurs.  He states that this occurs every couple of days or so but he cannot predict them.  His wife states that he still snores at night.  ZIO monitor did reveal pauses with PVCs at 4:30 AM.  He also had episodes of SVT with heart rates with average heart rate of 77 bpm and highest heart rate of 135 bpm which was a run of SVT.  Home Medications    Current Outpatient Medications  Medication Sig Dispense Refill   amLODipine (NORVASC) 5 MG tablet Take 1 tablet (5 mg total) by mouth daily. 30 tablet 2   propranolol (INDERAL) 10 MG tablet Take 1 tablet (10 mg total) by mouth 3 (three) times daily. (Patient taking differently: Take 10 mg by mouth 3 (three) times daily as needed (afib).) 30 tablet 1  traZODone (DESYREL) 50 MG tablet Take 1 tablet (50 mg total) by mouth at bedtime as needed for sleep. (Patient taking differently: Take 50 mg by mouth at bedtime.) 90 tablet 1   No current facility-administered medications for this visit.     Family History    Family History  Problem Relation Age of Onset   Hypertension Mother    Heart attack Father        Age 73   He indicated that his mother is alive. He indicated that his father is deceased. He indicated that his sister is alive.  Social History    Social History   Socioeconomic  History   Marital status: Married    Spouse name: Sean Carrillo   Number of children: 2   Years of education: Not on file   Highest education level: Some college, no degree  Occupational History   Not on file  Tobacco Use   Smoking status: Never   Smokeless tobacco: Never  Vaping Use   Vaping Use: Never used  Substance and Sexual Activity   Alcohol use: Never   Drug use: Never   Sexual activity: Yes  Other Topics Concern   Not on file  Social History Narrative   Lives with wife   Caffeine- rare   Social Determinants of Health   Financial Resource Strain: Not on file  Food Insecurity: Not on file  Transportation Needs: Not on file  Physical Activity: Not on file  Stress: Not on file  Social Connections: Not on file  Intimate Partner Violence: Not on file     Review of Systems    General:  No chills, fever, night sweats or weight changes.  Cardiovascular:  No chest pain, dyspnea on exertion, edema, orthopnea, palpitations, paroxysmal nocturnal dyspnea. Dermatological: No rash, lesions/masses Respiratory: No cough, dyspnea Urologic: No hematuria, dysuria Abdominal:   No nausea, vomiting, diarrhea, bright red blood per rectum, melena, or hematemesis Neurologic:  No visual changes, wkns, changes in mental status. All other systems reviewed and are otherwise negative except as noted above.     Physical Exam    VS:  BP (!) 142/84   Pulse 82   Ht '5\' 8"'$  (1.727 m)   Wt 218 lb (98.9 kg)   SpO2 97%   BMI 33.15 kg/m  , BMI Body mass index is 33.15 kg/m. STOP-Bang Score:  7      GEN: Well nourished, well developed, in no acute distress. HEENT: normal.  Well-healed incision. Neck: Supple, no JVD, carotid bruits, or masses. Cardiac: RRR, no murmurs, rubs, or gallops. No clubbing, cyanosis, edema.  Radials/DP/PT 2+ and equal bilaterally.  Respiratory:  Respirations regular and unlabored, clear to auscultation bilaterally. GI: Soft, nontender, nondistended, BS + x 4. MS: no  deformity or atrophy. Skin: warm and dry, no rash. Neuro:  Strength and sensation are intact. Psych: Normal affect.  Accessory Clinical Findings    ECG personally reviewed by me today-(not completed this office visit, reviewed ZIO monitor)   Lab Results  Component Value Date   WBC 9.3 05/10/2022   HGB 13.5 05/10/2022   HCT 41.6 05/10/2022   MCV 90.4 05/10/2022   PLT 331 05/10/2022   Lab Results  Component Value Date   CREATININE 0.99 05/10/2022   BUN 13 04/28/2022   NA 144 05/10/2022   K 3.6 05/10/2022   CL 108 04/28/2022   CO2 26 04/28/2022   No results found for: "ALT", "AST", "GGT", "ALKPHOS", "BILITOT" No results found for: "CHOL", "HDL", "  [place]", "LDLDIRECT", "TRIG", "CHOLHDL"  Lab Results  Component Value Date   HGBA1C 4.9 10/28/2020    Review of Prior Studies: Zio Monitor 06/07/2022 Patch Wear Time:  1 days and 18 hours (2024-01-25T13:03:54-0500 to 2024-01-27T07:46:16-0500)   Patient had a min HR of 56 bpm, max HR of 135 bpm, and avg HR of 77 bpm. Predominant underlying rhythm was Sinus Rhythm. 2 Supraventricular Tachycardia runs occurred, the run with the fastest interval lasting 4 beats with a max rate of 135 bpm, the  longest lasting 4 beats with an avg rate of 119 bpm. Isolated SVEs were rare (<1.0%), and no SVE Couplets or SVE Triplets were present. Isolated VEs were rare (<1.0%), VE Couplets were rare (<1.0%), and no VE Triplets were present. Inverted QRS complexes  possibly due to inverted placement of device.   Sinus bradycardia, normal sinus rhythm, sinus tachycardia, occasional PAC, 4 beats PAT, rare PVC and couplet. Sean Ruths, MD  Echo 01/2021: 1. Left ventricular ejection fraction, by estimation, is 60 to 65%. The  left ventricle has normal function. The left ventricle has no regional  wall motion abnormalities. There is mild concentric left ventricular  hypertrophy. Left ventricular diastolic  parameters were normal.   2. Right ventricular  systolic function is normal. The right ventricular  size is normal. Tricuspid regurgitation signal is inadequate for assessing  PA pressure.   3. The mitral valve is normal in structure. Trivial mitral valve  regurgitation. No evidence of mitral stenosis.   4. The aortic valve is normal in structure. Aortic valve regurgitation is  not visualized. No aortic stenosis is present.   Assessment & Plan   1.  PSVT with PVCs and with bradycardia: ZIO monitor has been reviewed with the patient and his wife.  I advised him not to take carvedilol as needed and to discontinue this altogether.  He will be restarted on propranolol 10 mg 3 times daily as needed only for rapid palpitations.   2.  Hypertension: Blood pressure remains elevated despite rechecking his BP in the clinic.  Recheck 152/88.  I will restart him on amlodipine 5 mg daily.  3.  Rule out OSA: Will start the process of getting him a home sleep study test.  He has had 1 ordered in the past but this was refused by insurance.  If necessary now with his frequent PVCs, slow heart rate during nighttime hours (high stop bang score) he will be eligible to have this completed.  4.  Status post right-sided craniotomy for biopsy and partial resection of midline of skull base tumor: Completed by Dr. Deatra Ina on 05/10/2022.  Follow-up as needed.  Continue physical and speech therapy as directed by surgeon.    Current medicines are reviewed at length with the patient today.  I have spent 45 min's  dedicated to the care of this patient on the date of this encounter to include pre-visit review of records, assessment, management and diagnostic testing,with shared decision making. Signed, Phill Myron. [place]ugh, ANP, AACC   06/25/2022 1:52 PM      Office [phone] Fax [phone]  Notice: This dictation was prepared with Dragon dictation along with smaller phrase technology. Any transcriptional errors that result from this process are  unintentional and may not be corrected upon review.

## 2022-06-22 NOTE — Therapy (Signed)
OUTPATIENT SPEECH LANGUAGE PATHOLOGY TREATMENT   Patient Name: Sean Carrillo. MRN: [id number] DOB:[birth year], 57 y.o., male Today's Date: 06/22/2022  PCP: Trey Sailors PA REFERRING PROVIDER: Judith Part, MD  END OF SESSION:  End of Session - 06/22/22 1622     Visit Number 4    Number of Visits 17    Date for SLP Re-Evaluation 07/29/22    SLP Start Time 30    SLP Stop Time  1612    SLP Time Calculation (min) 38 min    Activity Tolerance Patient tolerated treatment well              Past Medical History:  Diagnosis Date   Brain tumor ([place])    Hyperlipidemia    Hypertension    Paralysis ([place])    Past Surgical History:  Procedure Laterality Date   APPLICATION OF CRANIAL NAVIGATION N/A 05/10/2022   Procedure: APPLICATION OF CRANIAL NAVIGATION;  Surgeon: Judith Part, MD;  Location: [facility];  Service: Neurosurgery;  Laterality: N/A;   Arthroscopic knee surgery     CHOLECYSTECTOMY     CRANIOTOMY Right 05/10/2022   Procedure: Right craniotomy for tumor resection;  Surgeon: Judith Part, MD;  Location: [facility];  Service: Neurosurgery;  Laterality: Right;  RM 21   Patient Active Problem List   Diagnosis Date Noted   Bradycardia 05/13/2022   Brain tumor ([place]) 05/10/2022   Brain mass 03/09/2021   PVFS (postviral fatigue syndrome) 02/10/2021   Syncope 02/10/2021   Chronic insomnia 02/10/2021   Palpitations 04/10/2020   Essential hypertension 04/10/2020   Tear of lateral cartilage or meniscus of knee, current 12/16/2011    ONSET DATE: 05-10-22   REFERRING DIAG: D49.6 (ICD-10-CM) - Neoplasm of unspecified behavior of brain  THERAPY DIAG:  Cognitive communication deficit  Rationale for Evaluation and Treatment: Rehabilitation  SUBJECTIVE:   SUBJECTIVE STATEMENT: "I use my phone all the time now to help remember things." Pt accompanied by: self  PERTINENT HISTORY: Patient was admitted 1/8 to 05/13/22 following an uncomplicated  right sided craniotomy for biopsy and 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 Brain surgery 2-3 weeks ago. Pain has been bad since. Has tried pain medicine but has not had a good response. Pt notes increased weakness. Balance has been off as noted by wife. Wife reports pt has not been eating as much. Pt notes R eye is not the best right now. Pt reports difficulty with recall -- worried as this may affect his work.  PAIN:  Are you having pain? No  PATIENT GOALS: "I want to perform again."  OBJECTIVE:  Today's Treatment 06/22/22: Pt with one session each week for remaining two visits. Pt now remembering items he has put in his phone before phone alerts him. He is using compensations for memory with consistency at this time.  SLP introduced SFA and VNeST to pt in case he should experience difficulty remembering words as he did last week for "[place]". Pt and SLP proceeded together through one example of each technique and pt free to ask questions, which he did not. Pt recalled "[place]" was the town he could not recall last session, and he talked successfully with a friend about [place] this week using the word without hindrance.  06/16/22: Pt relates he is using  memory compensation of association for meds, his phone for calendar/appointments, and to do lists. Pt has been continuing to practice with You Tube videos to rehearse movement (for stamina) and song  transitions and order. During session pt experienced anomia with "[place]" - SLP cued pt to generate 5 sentences for every word with which he experiences anomia, and SLP wrote LTG for this.  06/09/22: SLP addressed songs pt did not recall details about during the rehearsal, and encouraged him to practice these songs - pt did not write down which songs were problematic so SLP provided pt with pencil/paper and he wrote them down. 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 SLP then talked with pt about med administration - he put alarm in his phone and generated a plan to take his meds in the morning indpendently. Pt spontaneously wrote down he needs to tell wife he is taking over meds and needs her to ensure proper administration for 4-5 days, and to inquire of her if she has seen any other areas that are deficient.  Pt is independent with bill pay (via phone) without skipping/missing/late payments, and tracking appointments (via phone) independently.   PATIENT EDUCATION: Education details: Pt was told to watch old recordings of shows and take notes on the details and also to sing with the shows with his notes, to foster recall of details and sequence. Person educated: Patient Education method: Explanation and Handouts Education comprehension: verbalized understanding   GOALS: Goals reviewed with patient? No  SHORT TERM GOALS: Target date: 06/30/22  Pt will bring memory system to 60% of sessions Baseline: 2/2 Goal status: Ongoing  2.  Pt will demo use/report use of memory system with extended time allowed, in/between 4 sessions Baseline: 06/09/22, 06/16/22 Goal status: Ongoing  3.  Pt will undergo further cognitive linguistic testing Baseline:  Goal status: Deferred    LONG TERM GOALS: Target date: 07/29/22  Pt will score higher/better on PROM than initial administration Baseline:  Goal status: Ongoing  2.  Pt will complete medication administration tasks with modified independence over two weeks Baseline: 06/16/22 Goal status: Deferred - pt only taking Magnesium (06/22/22)  3.  Pt will utilize memory strategies/system successfully over 3 weeks Baseline: 06/16/22, 06/22/22 Goal status: Ongoing  4.  Pt will report use of linguistic facilitation strategy (e.g., say 5 sentences using word he experienced with anomia, SFA, etc) was successful for subsequent attempts finding that word, x2 sessions Baseline: 06/22/22 Goal status:  Ongoing  ASSESSMENT:  CLINICAL IMPRESSION: Patient is a 57 y.o. male who was seen today for treatment of cognitive linguistics/memory. He is consistently using memory strategies for both home and vocational situations, and is details of songs cont to return. His first performance post-sx is in two weeks. He needs recall to perform tasks both at work and at home.   OBJECTIVE IMPAIRMENTS: include attention and memory. These impairments are limiting patient from return to work, managing medications, managing appointments, managing finances, household responsibilities, ADLs/IADLs, and effectively communicating at home and in community. Factors affecting potential to achieve goals and functional outcome are co-morbidities and severity of impairments.. Patient will benefit from skilled SLP services to address above impairments and improve overall function.  REHAB POTENTIAL: Good  PLAN:  SLP FREQUENCY: 2x/week  SLP DURATION: 8 weeks  PLANNED INTERVENTIONS: Environmental controls, Cueing hierachy, Cognitive reorganization, Internal/external aids, Functional tasks, SLP instruction and feedback, Compensatory strategies, and Patient/family education    [place], [place] 06/22/2022, 4:23 PM

## 2022-06-22 NOTE — Patient Instructions (Addendum)
VNeST diagram was provided  VNeST technique:   1) Choose a common verb from the list provided.   2) Think of 3 people (or "subjects") who could perform the action, or start by thinking of 3 objects the action could be done to. It might be easier to work on one complete set at a time, thinking of the object and person/subject together. The goal is to be as specific as possible with the nouns, so "farmer" would be a better subject than "man" for the verb drive. Write each subject and each object down on the paper in the appropriate column so you'll have 3 triads (subject-verb-object). It's okay to get personal. Family members, friends, and pets make great subjects for VNeST! Just try to vary the responses, so not all the nouns are personal or there is just one type of object. Similarly, try to use many different meanings of the verb if possible.   3) Read each triad of words aloud when you write them down on the lines below the chart. It's not important to conjugate the verb or add any articles to the nouns ("farmer drive tractor"), but it's okay if you do ("the farmer drives the tractor").   4) Select one of the three triads to expand upon. Ask "WHERE" it happens, "WHY" it happens, and "WHEN" it happens. Try to get as specific as possible, and write the responses down on the last line following the triad you chose to expand. Then, read the expanded sentence with the three answers to the questions. Again, the grammar doesn't matter as much as the focus is on connecting the concepts.    Semantic Feature Analysis (SFA) handout with target questions was provided

## 2022-06-24 ENCOUNTER — Ambulatory Visit: Payer: BC Managed Care – PPO

## 2022-06-24 DIAGNOSIS — R2681 Unsteadiness on feet: Secondary | ICD-10-CM

## 2022-06-24 DIAGNOSIS — M6281 Muscle weakness (generalized): Secondary | ICD-10-CM | POA: Diagnosis not present

## 2022-06-24 DIAGNOSIS — R2689 Other abnormalities of gait and mobility: Secondary | ICD-10-CM | POA: Diagnosis not present

## 2022-06-24 DIAGNOSIS — R41841 Cognitive communication deficit: Secondary | ICD-10-CM | POA: Diagnosis not present

## 2022-06-24 NOTE — Therapy (Signed)
OUTPATIENT PHYSICAL THERAPY TREATMENT   Patient Name: Sean Carrillo. MRN: [id number] DOB:[birth year], 57 y.o., male Today's Date: 06/24/2022   PCP: Raelyn Number REFERRING PROVIDER: Emelda Brothers  END OF SESSION:  PT End of Session - 06/24/22 1018     Visit Number 10    Number of Visits 16    Date for PT Re-Evaluation 07/20/22    Authorization Type BCBS    PT Start Time 1017    PT Stop Time 1100    PT Time Calculation (min) 43 min    Activity Tolerance Patient tolerated treatment well    Behavior During Therapy WFL for tasks assessed/performed             Past Medical History:  Diagnosis Date   Brain tumor ([place])    Hyperlipidemia    Hypertension    Paralysis ([place])    Past Surgical History:  Procedure Laterality Date   APPLICATION OF CRANIAL NAVIGATION N/A 05/10/2022   Procedure: APPLICATION OF CRANIAL NAVIGATION;  Surgeon: Judith Part, MD;  Location: [place] [facility];  Service: Neurosurgery;  Laterality: N/A;   Arthroscopic knee surgery     CHOLECYSTECTOMY     CRANIOTOMY Right 05/10/2022   Procedure: Right craniotomy for tumor resection;  Surgeon: Judith Part, MD;  Location: [facility];  Service: Neurosurgery;  Laterality: Right;  RM 21   Patient Active Problem List   Diagnosis Date Noted   Bradycardia 05/13/2022   Brain tumor ([place]) 05/10/2022   Brain mass 03/09/2021   PVFS (postviral fatigue syndrome) 02/10/2021   Syncope 02/10/2021   Chronic insomnia 02/10/2021   Palpitations 04/10/2020   Essential hypertension 04/10/2020   Tear of lateral cartilage or meniscus of knee, current 12/16/2011    ONSET DATE: ~2 weeks ago  REFERRING DIAG: G93.89 (ICD-10-CM) - Brain mass   THERAPY DIAG:  Cognitive communication deficit  Muscle weakness (generalized)  Unsteadiness on feet  Other abnormalities of gait and mobility  Rationale for Evaluation and Treatment: Rehabilitation  SUBJECTIVE:                                                                                                                                                                                              SUBJECTIVE STATEMENT: Patient was driven to session by wife. Patient reports he performed at [facility] last night and it went well. Patient reports no headaches and mild blurred vision today.   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 Yes: NPRS  scale: 4.5/10 Pain location: Front temple into behind R ear Pain description: Throbbing Aggravating factors: light Relieving factors: nothing thus far  PRECAUTIONS: None  WEIGHT BEARING RESTRICTIONS: No  FALLS: Has patient fallen in last 6 months? No  LIVING ENVIRONMENT: Lives with: lives with their spouse, [name] Lives in: House/apartment Stairs: Yes: Internal: 16 steps; on right going up and External: 2 steps; none Has following equipment at home: None  PLOF: Independent Pt sings for work Teacher, English as a foreign language) -- next gig is supposed to be next week but they had to cancel  PATIENT GOALS: Improve strength and cognition to return to work  OBJECTIVE:   DIAGNOSTIC FINDINGS: [facility] 05/11/22 IMPRESSION: 1. Postop right temporal craniotomy for biopsy. 2. 3 mm left hemispheric subdural hematoma. Mild subdural hematoma along the posterior falx and bilateral tentorium. 3. Lobular extra-axial soft tissue mass in the basilar cisterns appears similar to the prior MRI. 4. These results were called by telephone at the time of interpretation on 05/11/2022 at 5:12 pm to provider Ost[place]ard , who verbally acknowledged these results.  COGNITION: Overall cognitive status:  reports impaired memory and cognition   SENSATION: WFL "leg feels like rubber"  COORDINATION: Slow with all movements  EDEMA:  None  MUSCLE TONE: None noted  MUSCLE LENGTH: Did not assess  DTRs: Did not  assess  POSTURE: Holds head in flexed position, rounded shoulders/thoracic spine due to pain/fatigue  CERVICAL ROM:   Active ROM A/PROM (deg) eval AROM 06/24/22  Flexion 60 65  Extension 20* 40  Right lateral flexion 40 50  Left lateral flexion 30 40  Right rotation 40 55  Left rotation 45 65   (Blank rows = not tested)  * = pain Feels like head drops to the right  LOWER EXTREMITY ROM:   Grossly WFL  LOWER EXTREMITY MMT:    MMT Right Eval Left Eval  Hip flexion 3+ 4  Hip extension 4- 4-  Hip abduction 3+ 4-  Hip adduction    Hip internal rotation    Hip external rotation    Knee flexion 4 4  Knee extension 4 4  Ankle dorsiflexion    Ankle plantarflexion    Ankle inversion    Ankle eversion    (Blank rows = not tested)  UPPER EXTREMITY MMT: grossly 3+/5 bilat  BED MOBILITY: Independent but slow  TRANSFERS: Assistive device utilized: None  Sit to stand: Complete Independence Stand to sit: Complete Independence Chair to chair: Complete Independence Floor:  did not assess   STAIRS: fatigued after strength and balance testing to assess  GAIT: Gait pattern: step through pattern, shuffling, lateral lean- Right, and lateral lean- Left Distance walked: to back of clinic Assistive device utilized: None Level of assistance: Complete Independence Comments: n/a  FUNCTIONAL TESTS:  5 times sit to stand: 22.95 sec Berg Balance Scale: 47/56 Dynamic Gait Index: TBA 1/25: [place] 13/24    PATIENT SURVEYS:  FOTO not appropriate   TODAY'S TREATMENT:    [facility] Adult PT Treatment:                                                DATE: 06/24/2022 Therapeutic Exercise: LE stretches: standing quad stretch, alt side lunges, dynamic HS stretch + subjective intake Treadmill warm-up (2.0 mph, incline 0%-3%) + singing x 20 min STS x5 (timed) Cervical ROM measurements Neuromuscular re-ed: Mikel Cella Stroop card  [facility] Adult PT Treatment:                                                 DATE: 06/21/2022 Therapeutic Activity: Treadmill warm-up (1.9 mph, incline 0%-3%) + subjective intake --> naming cities in alphabetical order --> band name game Seated VORx1 yaw plane 5x30" Pencil push-ups  Fwd amb VORx1 yaw plane  [place] yaw plane standing -->walking   PATIENT EDUCATION: Education details: Aaron Edelman games while walking (dual taking with cognitive component) Person educated: Patient Education method: Consulting civil engineer, Media planner, and Handouts Education comprehension: verbalized understanding, returned demonstration, and needs further education  HOME EXERCISE PROGRAM: Access Code: [place] URL: [email or web address] Date: 06/21/2022 Prepared by: Helane Gunther  Exercises - Supine Shoulder Flexion Extension AAROM with [place]  - 1 x daily - 7 x weekly - 2 sets - 10 reps - Supine Scapular Retraction  - 1 x daily - 7 x weekly - 2 sets - 10 reps - 3 sec hold - Supine Chin Tuck  - 1 x daily - 7 x weekly - 2 sets - 10 reps - 3 sec hold - Supine Bridge  - 1 x daily - 7 x weekly - 2 sets - 10 reps - Clamshell  - 1 x daily - 7 x weekly - 2 sets - 10 reps - Seated Gaze Stabilization with Head Rotation  - 1 x daily - 7 x weekly - 3 sets - 10 reps - Seated Gaze Stabilization with Head Nod  - 3 x daily - 7 x weekly - 3 sets - 10 reps - Seated Gaze Stabilization with Head Nod and Vertical Arm Movement  - 3 x daily - 7 x weekly - 3 sets - 10 reps - Narrow Stance Gaze Stabilization with Two Near Targets and Head Rotation  - 3 x daily - 7 x weekly - 3 sets - 10 reps   GOALS: Goals reviewed with patient? Yes  SHORT TERM GOALS: Target date: 06/23/2022  Pt will be ind with initial HEP Baseline: Goal status: MET 06/24/22  2.  Pt will demo improved 5x STS to </=15 sec for decreased fall risk Baseline:  Goal status: MET  8 sec 06/24/22  3.  Pt will demo improved cervical ROM for lateral flexion and rotation by >/=10 deg Baseline:  Goal status: MET  06/24/22   LONG TERM GOALS: Target date: 07/21/2022  Pt will be ind with progression and advancement of HEP Baseline:  Goal status: INITIAL  2.  Pt will have improved Berg Balance Score to >/=52/56 for decreased fall risk Baseline:  Goal status: INITIAL  3.  Pt will improve 5x STS to </=13 sec to decrease fall risk Baseline:  Goal status: INITIAL  4.  Pt will demo enough endurance to get through a full setlist of songs for return to work Baseline:  Goal status: INITIAL  5.  Pt will report improved headache by >/=50% Baseline:  Goal status: INITIAL   ASSESSMENT:  CLINICAL IMPRESSION:  Patient able to complete Brock string and stroop card exercises with no exacerbation of symptoms. Good progress in cervical ROM, most notably measured with extension and lateral flexion. Patient demonstrated good progression with endurance and tolerance for dual tasking. Recommended patient incorporate more cognitive tasks (brain games) while walking.    OBJECTIVE IMPAIRMENTS: Abnormal gait, decreased activity tolerance, decreased balance, decreased endurance, decreased mobility,  difficulty walking, decreased ROM, decreased strength, impaired sensation, impaired UE functional use, improper body mechanics, postural dysfunction, and pain.    PLAN:  PT FREQUENCY: 2x/week  PT DURATION: 8 weeks  PLANNED INTERVENTIONS: Therapeutic exercises, Therapeutic activity, Neuromuscular re-education, Balance training, Gait training, Patient/Family education, Self Care, Joint mobilization, Stair training, Vestibular training, Aquatic Therapy, Dry Needling, Electrical stimulation, Spinal mobilization, Cryotherapy, Moist heat, Taping, Traction, Ionotophoresis 72m/ml Dexamethasone, Manual therapy, and Re-evaluation  PLAN FOR NEXT SESSION: Dual tasking (singing); Continue to work on strength/endurance/balance. Manual as needed to decrease cervical tension/headaches.  KHardin Negus PTA 06/24/2022, 11:04 AM

## 2022-06-25 ENCOUNTER — Encounter: Payer: Self-pay | Admitting: Adult Health

## 2022-06-25 ENCOUNTER — Ambulatory Visit: Payer: BC Managed Care – PPO | Attending: Adult Health | Admitting: Adult Health

## 2022-06-25 VITALS — BP 142/84 | HR 82 | Ht 68.0 in | Wt 218.0 lb

## 2022-06-25 DIAGNOSIS — G4733 Obstructive sleep apnea (adult) (pediatric): Secondary | ICD-10-CM

## 2022-06-25 DIAGNOSIS — R002 Palpitations: Secondary | ICD-10-CM | POA: Diagnosis not present

## 2022-06-25 DIAGNOSIS — R001 Bradycardia, unspecified: Secondary | ICD-10-CM | POA: Diagnosis not present

## 2022-06-25 DIAGNOSIS — I10 Essential (primary) hypertension: Secondary | ICD-10-CM | POA: Diagnosis not present

## 2022-06-25 DIAGNOSIS — I471 Supraventricular tachycardia, unspecified: Secondary | ICD-10-CM

## 2022-06-25 DIAGNOSIS — D496 Neoplasm of unspecified behavior of brain: Secondary | ICD-10-CM

## 2022-06-25 MED ORDER — PROPRANOLOL HCL 10 MG PO TABS: 10.0000 mg | ORAL_TABLET | Freq: Three times a day (TID) | ORAL | 2 refills | Status: DC | PRN

## 2022-06-25 MED ORDER — AMLODIPINE BESYLATE 5 MG PO TABS: 5.0000 mg | ORAL_TABLET | Freq: Every day | ORAL | 2 refills | Status: DC

## 2022-06-25 NOTE — Patient Instructions (Signed)
Medication Instructions:  No Changes *If you need a refill on your cardiac medications before your next appointment, please call your pharmacy*   Lab Work: No Labs If you have labs (blood work) drawn today and your tests are completely normal, you will receive your results only by: Advance (if you have MyChart) OR A paper copy in the mail If you have any lab test that is abnormal or we need to change your treatment, we will call you to review the results.   Testing/Procedures: [place]. Your physician has recommended that you have a sleep study. This test records several body functions during sleep, including: brain activity, eye movement, oxygen and carbon dioxide blood levels, heart rate and rhythm, breathing rate and rhythm, the flow of air through your mouth and nose, snoring, body muscle movements, and chest and belly movement.    Follow-Up: At [facility], you and your health needs are our priority.  As part of our continuing mission to provide you with exceptional heart care, we have created designated Provider Care Teams.  These Care Teams include your primary Cardiologist (physician) and Advanced Practice Providers (APPs -  Physician Assistants and Nurse Practitioners) who all work together to provide you with the care you need, when you need it.  We recommend signing up for the patient portal called "MyChart".  Sign up information is provided on this After Visit Summary.  MyChart is used to connect with patients for Virtual Visits (Telemedicine).  Patients are able to view lab/test results, encounter notes, upcoming appointments, etc.  Non-urgent messages can be sent to your provider as well.   To learn more about what you can do with MyChart, go to [email or web address].    Your next appointment:   2-3 month(s)  Provider:   Kirk Ruths, MD

## 2022-06-29 ENCOUNTER — Telehealth: Payer: Self-pay | Admitting: Internal Medicine

## 2022-06-29 ENCOUNTER — Ambulatory Visit: Payer: BC Managed Care – PPO

## 2022-06-29 DIAGNOSIS — R41841 Cognitive communication deficit: Secondary | ICD-10-CM | POA: Diagnosis not present

## 2022-06-29 DIAGNOSIS — R2681 Unsteadiness on feet: Secondary | ICD-10-CM | POA: Diagnosis not present

## 2022-06-29 DIAGNOSIS — R2689 Other abnormalities of gait and mobility: Secondary | ICD-10-CM

## 2022-06-29 DIAGNOSIS — M6281 Muscle weakness (generalized): Secondary | ICD-10-CM | POA: Diagnosis not present

## 2022-06-29 LAB — SURGICAL PATHOLOGY

## 2022-06-29 NOTE — Therapy (Signed)
OUTPATIENT PHYSICAL THERAPY TREATMENT   Patient Name: Sean Carrillo. MRN: [id number] DOB:1965-05-20, 57 y.o., male Today's Date: 06/29/2022   PCP: Raelyn Number REFERRING PROVIDER: Emelda Brothers  END OF SESSION:  PT End of Session - 06/29/22 1103     Visit Number 11    Number of Visits 16    Date for PT Re-Evaluation 07/20/22    Authorization Type BCBS    PT Start Time 1103    PT Stop Time 1150    PT Time Calculation (min) 47 min    Activity Tolerance Patient tolerated treatment well    Behavior During Therapy WFL for tasks assessed/performed             Past Medical History:  Diagnosis Date   Brain tumor ([place])    Hyperlipidemia    Hypertension    Paralysis ([place])    Past Surgical History:  Procedure Laterality Date   APPLICATION OF CRANIAL NAVIGATION N/A 05/10/2022   Procedure: APPLICATION OF CRANIAL NAVIGATION;  Surgeon: Judith Part, MD;  Location: [facility];  Service: Neurosurgery;  Laterality: N/A;   Arthroscopic knee surgery     CHOLECYSTECTOMY     CRANIOTOMY Right 05/10/2022   Procedure: Right craniotomy for tumor resection;  Surgeon: Judith Part, MD;  Location: [facility];  Service: Neurosurgery;  Laterality: Right;  RM 21   Patient Active Problem List   Diagnosis Date Noted   Bradycardia 05/13/2022   Brain tumor ([place]) 05/10/2022   Brain mass 03/09/2021   PVFS (postviral fatigue syndrome) 02/10/2021   Syncope 02/10/2021   Chronic insomnia 02/10/2021   Palpitations 04/10/2020   Essential hypertension 04/10/2020   Tear of lateral cartilage or meniscus of knee, current 12/16/2011    ONSET DATE: ~2 weeks ago  REFERRING DIAG: G93.89 (ICD-10-CM) - Brain mass   THERAPY DIAG:  Cognitive communication deficit  Muscle weakness (generalized)  Unsteadiness on feet  Other abnormalities of gait and mobility  Rationale for Evaluation and Treatment: Rehabilitation  SUBJECTIVE:                                                                                                                                                                                              SUBJECTIVE STATEMENT: Patient reports his blurred vision is better, states when he feels it coming on he does VORx1 exercises and they help stabilize his gaze. Patient states he had another good round of karaoke since last PT visit.    PERTINENT HISTORY: Patient was admitted 1/8 to 05/13/22 following an uncomplicated right sided craniotomy for biopsy and partial resection of a midline skull base mass  PAIN:  Are you having pain? Yes: NPRS scale: 0/10 Pain location: Front temple into behind R ear Pain description: Throbbing Aggravating factors: light Relieving factors: nothing thus far  PRECAUTIONS: None  WEIGHT BEARING RESTRICTIONS: No  FALLS: Has patient fallen in last 6 months? 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 Postop right temporal craniotomy for biopsy. 2. 3 mm left hemispheric subdural hematoma. Mild subdural hematoma along the posterior falx and bilateral tentorium. 3. Lobular extra-axial soft tissue mass in the basilar cisterns appears similar to the prior MRI. 4. These results were called by telephone at the time of interpretation on 05/11/2022 at 5:12 pm to provider Ostergard , who verbally acknowledged these results.  COGNITION: Overall cognitive status:  reports impaired memory and cognition   SENSATION: WFL "leg feels like rubber"  COORDINATION: Slow with all movements  EDEMA:  None  MUSCLE TONE: None  noted  MUSCLE LENGTH: Did not assess  DTRs: Did not assess  POSTURE: Holds head in flexed position, rounded shoulders/thoracic spine due to pain/fatigue  CERVICAL ROM:   Active ROM A/PROM (deg) eval AROM 06/24/22  Flexion 60 65  Extension 20* 40  Right lateral flexion 40 50  Left lateral flexion 30 40  Right rotation 40 55  Left rotation 45 65   (Blank rows = not tested)  * = pain Feels like head drops to the right  LOWER EXTREMITY ROM:   Grossly WFL  LOWER EXTREMITY MMT:    MMT Right Eval Left Eval Right  06/29/22 Left  06/29/22  Hip flexion 3+ '4 5 5  '$ Hip extension 4- 4- 5 5  Hip abduction 3+ 4- 5 4+  Hip adduction      Hip internal rotation      Hip external rotation      Knee flexion '4 4 5 5  '$ Knee extension '4 4 5 5  '$ Ankle dorsiflexion      Ankle plantarflexion      Ankle inversion      Ankle eversion      (Blank rows = not tested)  UPPER EXTREMITY MMT: grossly 3+/5 bilat  BED MOBILITY: Independent but slow  TRANSFERS: Assistive device utilized: None  Sit to stand: Complete Independence Stand to sit: Complete Independence Chair to chair: Complete Independence Floor:  did not assess   STAIRS: fatigued after strength and balance testing to assess  GAIT: Gait pattern: step through pattern, shuffling, lateral lean- Right, and lateral lean- Left Distance walked: to back of clinic Assistive device utilized: None Level of assistance: Complete Independence Comments: n/a  FUNCTIONAL TESTS:  5 times sit to stand: 22.95 sec Berg Balance Scale: 47/56 Dynamic Gait Index: TBA 1/25: [place] 13/24  [facility] PT Assessment - 06/29/22 0001       Berg Balance Test   Sit to Stand Able to stand without using hands and stabilize independently    Standing Unsupported Able to stand safely 2 minutes    Sitting with Back Unsupported but Feet Supported on Floor or Stool Able to sit safely and securely 2 minutes    Stand to Sit Sits  safely with minimal use of hands     Transfers Able to transfer safely, minor use of hands    Standing Unsupported with Eyes Closed Able to stand 10 seconds safely    Standing Unsupported with Feet Together Able to place feet together independently and stand 1 minute safely    From Standing, Reach Forward with Outstretched Arm Can reach confidently >25 cm (10")    From Standing Position, Pick up Object from Floor Able to pick up shoe safely and easily    From Standing Position, Turn to Look Behind Over each Shoulder Looks behind from both sides and weight shifts well    Turn 360 Degrees Able to turn 360 degrees safely in 4 seconds or less    Standing Unsupported, Alternately Place Feet on Step/Stool Able to stand independently and safely and complete 8 steps in 20 seconds    Standing Unsupported, One Foot in Front Able to place foot tandem independently and hold 30 seconds    Standing on One Leg Able to lift leg independently and hold > 10 seconds    Total Score 56               PATIENT SURVEYS:  FOTO not appropriate   TODAY'S TREATMENT:    [facility] Adult PT Treatment:                                                DATE: 06/29/2022 Therapeutic Activity: Treadmill: 2.0-2.4 mph, intermittent changing incline 0%-5%) + subjective intake & singing x 20 min REO static balance x 48mn --> REC static balance x160m tandem balance EO & EC x30" each foot  Reaching beyond BOS  Habituation forward bending cone pick up  R/L head turns -> 360 degree turn SLB x30" each    [facility] Adult PT Treatment:                                                DATE: 06/24/2022 Therapeutic Exercise: LE stretches: standing quad stretch, alt side lunges, dynamic HS stretch + subjective intake Treadmill warm-up (2.0 mph, incline 0%-3%) + singing x 20 min STS x5 (timed) Cervical ROM measurements Neuromuscular re-ed: BrMikel Cellatroop card   PATIENT EDUCATION: Education details: BrAaron Edelmanames while walking (dual taking with cognitive  component) Person educated: Patient Education method: ExConsulting civil engineerDeMedia plannerand Handouts Education comprehension: verbalized understanding, returned demonstration, and needs further education  HOME EXERCISE PROGRAM: Access Code: [email or web address]: [email or web address] Date: 06/21/2022 Prepared by: KaHelane GuntherExercises - Supine Shoulder Flexion Extension AAROM with Dowel  - 1 x daily - 7 x weekly - 2 sets - 10 reps - Supine Scapular Retraction  - 1 x daily - 7 x weekly - 2 sets - 10 reps - 3 sec hold - Supine Chin Tuck  - 1 x daily - 7 x weekly - 2 sets - 10 reps - 3 sec hold - Supine Bridge  - 1 x daily - 7 x weekly - 2 sets - 10 reps - Clamshell  - 1 x daily - 7 x weekly - 2 sets - 10 reps - Seated Gaze Stabilization with Head Rotation  - 1 x daily - 7 x weekly - 3 sets - 10  reps - Seated Gaze Stabilization with Head Nod  - 3 x daily - 7 x weekly - 3 sets - 10 reps - Seated Gaze Stabilization with Head Nod and Vertical Arm Movement  - 3 x daily - 7 x weekly - 3 sets - 10 reps - Narrow Stance Gaze Stabilization with Two Near Targets and Head Rotation  - 3 x daily - 7 x weekly - 3 sets - 10 reps   GOALS: Goals reviewed with patient? Yes  SHORT TERM GOALS: Target date: 06/23/2022  Pt will be ind with initial HEP Baseline: Goal status: MET 06/24/22  2.  Pt will demo improved 5x STS to </=15 sec for decreased fall risk Baseline:  Goal status: MET  8 sec 06/24/22  3.  Pt will demo improved cervical ROM for lateral flexion and rotation by >/=10 deg Baseline:  Goal status: MET 06/24/22   LONG TERM GOALS: Target date: 07/21/2022  Pt will be ind with progression and advancement of HEP Baseline:  Goal status: MET on 06/29/22  2.  Pt will have improved Berg Balance Score to >/=52/56 for decreased fall risk Baseline:  Goal status: MET 56/56 on 06/29/22  3.  Pt will improve 5x STS to </=13 sec to decrease fall risk Baseline:  Goal status: MET 8 sec on  06/24/22  4.  Pt will demo enough endurance to get through a full setlist of songs for return to work Baseline:  Goal status: INITIAL  5.  Pt will report improved headache by >/=50% Baseline:  Goal status: MET on 06/29/22   ASSESSMENT:  CLINICAL IMPRESSION:  Patient continues to demonstrate improved endurance during dual tasking activities on treadmill; changes in incline incorporated to challenge cardiovascular endurance. Hip and knee MMT strength improved since evaluation, with greatest improvements in R hip abduction and flexion strength. Patient able to complete all activities with no exacerbation of symptoms. Patient continues to demonstrate mild fatigue with prolonged activity and intermittent cognitive lag during dual tasking activities. Patient will continue to benefit from skilled therapy to address remaining deficits and prepare for upcoming show.    OBJECTIVE IMPAIRMENTS: Abnormal gait, decreased activity tolerance, decreased balance, decreased endurance, decreased mobility, difficulty walking, decreased ROM, decreased strength, impaired sensation, impaired UE functional use, improper body mechanics, postural dysfunction, and pain.    PLAN:  PT FREQUENCY: 2x/week  PT DURATION: 8 weeks  PLANNED INTERVENTIONS: Therapeutic exercises, Therapeutic activity, Neuromuscular re-education, Balance training, Gait training, Patient/Family education, Self Care, Joint mobilization, Stair training, Vestibular training, Aquatic Therapy, Dry Needling, Electrical stimulation, Spinal mobilization, Cryotherapy, Moist heat, Taping, Traction, Ionotophoresis '4mg'$ /ml Dexamethasone, Manual therapy, and Re-evaluation  PLAN FOR NEXT SESSION: Endurance/strength training + dual tasking (singing/transitions with songs)   Hardin Negus, PTA 06/29/2022, 11:54 AM

## 2022-06-29 NOTE — Telephone Encounter (Signed)
Per 2/23 IB reached out to schedule patient with [facility], left voicemail for patient to call back.

## 2022-07-01 DIAGNOSIS — R051 Acute cough: Secondary | ICD-10-CM | POA: Diagnosis not present

## 2022-07-01 DIAGNOSIS — J0110 Acute frontal sinusitis, unspecified: Secondary | ICD-10-CM | POA: Diagnosis not present

## 2022-07-05 ENCOUNTER — Ambulatory Visit: Payer: BC Managed Care – PPO | Attending: Neurological Surgery

## 2022-07-05 DIAGNOSIS — R2681 Unsteadiness on feet: Secondary | ICD-10-CM | POA: Diagnosis not present

## 2022-07-05 DIAGNOSIS — R41841 Cognitive communication deficit: Secondary | ICD-10-CM | POA: Diagnosis not present

## 2022-07-05 DIAGNOSIS — R2689 Other abnormalities of gait and mobility: Secondary | ICD-10-CM | POA: Diagnosis not present

## 2022-07-05 DIAGNOSIS — M6281 Muscle weakness (generalized): Secondary | ICD-10-CM | POA: Diagnosis not present

## 2022-07-05 NOTE — Therapy (Signed)
OUTPATIENT PHYSICAL THERAPY TREATMENT   Patient Name: Sean Carrillo. MRN: [id number] DOB:July 08, 1965, 57 y.o., male Today's Date: 07/05/2022   PCP: Raelyn Number REFERRING PROVIDER: Emelda Brothers  END OF SESSION:  PT End of Session - 07/05/22 1404     Visit Number 12    Number of Visits 16    Date for PT Re-Evaluation 07/20/22    Authorization Type BCBS    PT Start Time 1403    PT Stop Time [id number]    PT Time Calculation (min) 44 min    Activity Tolerance Patient tolerated treatment well    Behavior During Therapy WFL for tasks assessed/performed             Past Medical History:  Diagnosis Date   Brain tumor ([place])    Hyperlipidemia    Hypertension    Paralysis ([place])    Past Surgical History:  Procedure Laterality Date   APPLICATION OF CRANIAL NAVIGATION N/A 05/10/2022   Procedure: APPLICATION OF CRANIAL NAVIGATION;  Surgeon: Judith Part, MD;  Location: [facility];  Service: Neurosurgery;  Laterality: N/A;   Arthroscopic knee surgery     CHOLECYSTECTOMY     CRANIOTOMY Right 05/10/2022   Procedure: Right craniotomy for tumor resection;  Surgeon: Judith Part, MD;  Location: [facility];  Service: Neurosurgery;  Laterality: Right;  RM 21   Patient Active Problem List   Diagnosis Date Noted   Bradycardia 05/13/2022   Brain tumor ([place]) 05/10/2022   Brain mass 03/09/2021   PVFS (postviral fatigue syndrome) 02/10/2021   Syncope 02/10/2021   Chronic insomnia 02/10/2021   Palpitations 04/10/2020   Essential hypertension 04/10/2020   Tear of lateral cartilage or meniscus of knee, current 12/16/2011    ONSET DATE: ~2 weeks ago  REFERRING DIAG: G93.89 (ICD-10-CM) - Brain mass   THERAPY DIAG:  Cognitive communication deficit  Muscle weakness (generalized)  Unsteadiness on feet  Other abnormalities of gait and mobility  Rationale for Evaluation and Treatment: Rehabilitation  SUBJECTIVE:                                                                                                                                                                                              SUBJECTIVE STATEMENT: 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 Yes: NPRS scale: 0/10 Pain location: Front temple into behind  R ear Pain description: Throbbing Aggravating factors: light Relieving factors: nothing thus far  PRECAUTIONS: None  WEIGHT BEARING RESTRICTIONS: No  FALLS: Has patient fallen in last 6 months? 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 Postop right temporal craniotomy for biopsy. 2. 3 mm left hemispheric subdural hematoma. Mild subdural hematoma along the posterior falx and bilateral tentorium. 3. Lobular extra-axial soft tissue mass in the basilar cisterns appears similar to the prior MRI. 4. These results were called by telephone at the time of interpretation on 05/11/2022 at 5:12 pm to provider Ostergard , who verbally acknowledged these results.  COGNITION: Overall cognitive status:  reports impaired memory and cognition   SENSATION: WFL "leg feels like rubber"  COORDINATION: Slow with all movements  EDEMA:  None  MUSCLE TONE: None noted  MUSCLE LENGTH: Did not assess  DTRs: Did not assess  POSTURE: Holds head in flexed  position, rounded shoulders/thoracic spine due to pain/fatigue  CERVICAL ROM:   Active ROM A/PROM (deg) eval AROM 06/24/22  Flexion 60 65  Extension 20* 40  Right lateral flexion 40 50  Left lateral flexion 30 40  Right rotation 40 55  Left rotation 45 65   (Blank rows = not tested)  * = pain Feels like head drops to the right  LOWER EXTREMITY ROM:   Grossly WFL  LOWER EXTREMITY MMT:    MMT Right Eval Left Eval Right  06/29/22 Left  06/29/22  Hip flexion 3+ '4 5 5  '$ Hip extension 4- 4- 5 5  Hip abduction 3+ 4- 5 4+  Hip adduction      Hip internal rotation      Hip external rotation      Knee flexion '4 4 5 5  '$ Knee extension '4 4 5 5  '$ Ankle dorsiflexion      Ankle plantarflexion      Ankle inversion      Ankle eversion      (Blank rows = not tested)  UPPER EXTREMITY MMT: grossly 3+/5 bilat  BED MOBILITY: Independent but slow  TRANSFERS: Assistive device utilized: None  Sit to stand: Complete Independence Stand to sit: Complete Independence Chair to chair: Complete Independence Floor:  did not assess   STAIRS: fatigued after strength and balance testing to assess  GAIT: Gait pattern: step through pattern, shuffling, lateral lean- Right, and lateral lean- Left Distance walked: to back of clinic Assistive device utilized: None Level of assistance: Complete Independence Comments: n/a  FUNCTIONAL TESTS:  5 times sit to stand: 22.95 sec Berg Balance Scale: 47/56 Dynamic Gait Index: TBA 1/25: [place] 13/24   PATIENT SURVEYS:  FOTO not appropriate   TODAY'S TREATMENT:    [facility] Adult PT Treatment:                                                DATE: 07/05/2022 Therapeutic Activity: Treadmill: 2.1 mph, incline (varying levels) 0% -- 8% + singing x 15 min Fwd/bkwd amb (  1L each) VORx1 Y/P head turns Trampoline: perturbations + EO slow head turns 5"--> 3"--> 1" 11#MB slams + squat pick up + singing 1L Clock yourself: simple clock 80 SPM --> 90 SPM x 1 min  each Clock Yourself: brain games 30 SPM x 83mn    [facility] Adult PT Treatment:                                                DATE: 06/29/2022 Therapeutic Activity: Treadmill: 2.0-2.4 mph, intermittent changing incline 0%-5%) + subjective intake & singing x 20 min REO static balance x 120m --> REC static balance x1m69mtandem balance EO & EC x30" each foot  Reaching beyond BOS  Habituation forward bending cone pick up  R/L head turns -> 360 degree turn SLB x30" each   PATIENT EDUCATION: Education details: BriAaron Edelmanmes while walking (dual taking with cognitive component) Person educated: Patient Education method: ExpConsulting civil engineeremMedia plannernd Handouts Education comprehension: verbalized understanding, returned demonstration, and needs further education  HOME EXERCISE PROGRAM: Access Code: [email or web address]: [email or web address] Date: 06/21/2022 Prepared by: KatHelane Guntherxercises - Supine Shoulder Flexion Extension AAROM with Dowel  - 1 x daily - 7 x weekly - 2 sets - 10 reps - Supine Scapular Retraction  - 1 x daily - 7 x weekly - 2 sets - 10 reps - 3 sec hold - Supine Chin Tuck  - 1 x daily - 7 x weekly - 2 sets - 10 reps - 3 sec hold - Supine Bridge  - 1 x daily - 7 x weekly - 2 sets - 10 reps - Clamshell  - 1 x daily - 7 x weekly - 2 sets - 10 reps - Seated Gaze Stabilization with Head Rotation  - 1 x daily - 7 x weekly - 3 sets - 10 reps - Seated Gaze Stabilization with Head Nod  - 3 x daily - 7 x weekly - 3 sets - 10 reps - Seated Gaze Stabilization with Head Nod and Vertical Arm Movement  - 3 x daily - 7 x weekly - 3 sets - 10 reps - Narrow Stance Gaze Stabilization with Two Near Targets and Head Rotation  - 3 x daily - 7 x weekly - 3 sets - 10 reps   GOALS: Goals reviewed with patient? Yes  SHORT TERM GOALS: Target date: 06/23/2022  Pt will be ind with initial HEP Baseline: Goal status: MET 06/24/22  2.  Pt will demo improved 5x STS to </=15 sec for  decreased fall risk Baseline:  Goal status: MET  8 sec 06/24/22  3.  Pt will demo improved cervical ROM for lateral flexion and rotation by >/=10 deg Baseline:  Goal status: MET 06/24/22   LONG TERM GOALS: Target date: 07/21/2022  Pt will be ind with progression and advancement of HEP Baseline:  Goal status: MET on 06/29/22  2.  Pt will have improved Berg Balance Score to >/=52/56 for decreased fall risk Baseline:  Goal status: MET 56/56 on 06/29/22  3.  Pt will improve 5x STS to </=13 sec to decrease fall risk Baseline:  Goal status: MET 8 sec on 06/24/22  4.  Pt will demo enough endurance to get through a full setlist of songs for return to work Baseline:  Goal status: INITIAL  5.  Pt will report improved headache by >/=50% Baseline:  Goal status: MET on 06/29/22   ASSESSMENT:  CLINICAL IMPRESSION:  Clock Yourself app utilized to challenge quick dynamic weight shifting with a cognitive component; patient required occasional verbal cueing for foot placement, more so in posterior plane. Patient able to complete all interventions with no exacerbation on symptoms. Will follow up with patient at next visit on how performance went with symptoms and endurance.    OBJECTIVE IMPAIRMENTS: Abnormal gait, decreased activity tolerance, decreased balance, decreased endurance, decreased mobility, difficulty walking, decreased ROM, decreased strength, impaired sensation, impaired UE functional use, improper body mechanics, postural dysfunction, and pain.    PLAN:  PT FREQUENCY: 2x/week  PT DURATION: 8 weeks  PLANNED INTERVENTIONS: Therapeutic exercises, Therapeutic activity, Neuromuscular re-education, Balance training, Gait training, Patient/Family education, Self Care, Joint mobilization, Stair training, Vestibular training, Aquatic Therapy, Dry Needling, Electrical stimulation, Spinal mobilization, Cryotherapy, Moist heat, Taping, Traction, Ionotophoresis '4mg'$ /ml Dexamethasone, Manual  therapy, and Re-evaluation  PLAN FOR NEXT SESSION: Follow up on 3/8 show Hardin Negus, PTA 07/05/2022, 4:35 PM

## 2022-07-09 ENCOUNTER — Other Ambulatory Visit: Payer: Self-pay | Admitting: *Deleted

## 2022-07-09 DIAGNOSIS — G4733 Obstructive sleep apnea (adult) (pediatric): Secondary | ICD-10-CM

## 2022-07-09 DIAGNOSIS — R002 Palpitations: Secondary | ICD-10-CM

## 2022-07-09 DIAGNOSIS — R001 Bradycardia, unspecified: Secondary | ICD-10-CM

## 2022-07-12 ENCOUNTER — Ambulatory Visit: Payer: BC Managed Care – PPO

## 2022-07-13 ENCOUNTER — Inpatient Hospital Stay: Payer: BC Managed Care – PPO | Admitting: Internal Medicine

## 2022-07-14 ENCOUNTER — Ambulatory Visit: Payer: BC Managed Care – PPO

## 2022-07-14 DIAGNOSIS — R2689 Other abnormalities of gait and mobility: Secondary | ICD-10-CM | POA: Diagnosis not present

## 2022-07-14 DIAGNOSIS — R41841 Cognitive communication deficit: Secondary | ICD-10-CM | POA: Diagnosis not present

## 2022-07-14 DIAGNOSIS — R2681 Unsteadiness on feet: Secondary | ICD-10-CM | POA: Diagnosis not present

## 2022-07-14 DIAGNOSIS — M6281 Muscle weakness (generalized): Secondary | ICD-10-CM | POA: Diagnosis not present

## 2022-07-14 NOTE — Therapy (Signed)
OUTPATIENT PHYSICAL THERAPY TREATMENT   Patient Name: Sean Carrillo. MRN: [id number] DOB:1966/04/30, 57 y.o., male Today's Date: 07/14/2022   PCP: Raelyn Number REFERRING PROVIDER: Emelda Brothers  END OF SESSION:  PT End of Session - 07/14/22 1403     Visit Number 13    Number of Visits 16    Date for PT Re-Evaluation 07/20/22    Authorization Type [employer]    PT Start Time 1404    PT Stop Time 1422    PT Time Calculation (min) 18 min    Activity Tolerance Patient tolerated treatment well    Behavior During Therapy WFL for tasks assessed/performed             Past Medical History:  Diagnosis Date   Brain tumor ([place])    Hyperlipidemia    Hypertension    Paralysis ([place])    Past Surgical History:  Procedure Laterality Date   APPLICATION OF CRANIAL NAVIGATION N/A 05/10/2022   Procedure: APPLICATION OF CRANIAL NAVIGATION;  Surgeon: Judith Part, MD;  Location: [place];  Service: Neurosurgery;  Laterality: N/A;   Arthroscopic knee surgery     CHOLECYSTECTOMY     CRANIOTOMY Right 05/10/2022   Procedure: Right craniotomy for tumor resection;  Surgeon: Judith Part, MD;  Location: [place];  Service: Neurosurgery;  Laterality: Right;  RM 21   Patient Active Problem List   Diagnosis Date Noted   Bradycardia 05/13/2022   Brain tumor ([place]) 05/10/2022   Brain mass 03/09/2021   PVFS (postviral fatigue syndrome) 02/10/2021   Syncope 02/10/2021   Chronic insomnia 02/10/2021   Palpitations 04/10/2020   Essential hypertension 04/10/2020   Tear of lateral cartilage or meniscus of knee, current 12/16/2011    ONSET DATE: ~2 weeks ago  REFERRING DIAG: G93.89 (ICD-10-CM) - Brain mass   THERAPY DIAG:  Cognitive communication deficit  Muscle weakness (generalized)  Unsteadiness on feet  Other abnormalities of gait and mobility  Rationale for Evaluation and Treatment: Rehabilitation  SUBJECTIVE:                                                                                                                                                                                              SUBJECTIVE STATEMENT: 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 Patient states by the second show he felt back to his status prior to episode. Patient states he was pleasantly surprised at how well the performances went and is feeling optimistic about upcoming shows. Patient has been headache free approx 1  month; states he has occasional mild blurred vision however doing VOR exercises quickly decreases symptoms. 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 Yes: NPRS scale: 0/10 Pain location: Front temple into behind R ear Pain description: Throbbing Aggravating factors: light Relieving factors: nothing thus far  PRECAUTIONS: None  WEIGHT BEARING RESTRICTIONS: No  FALLS: Has patient fallen in last 6 months? No  LIVING ENVIRONMENT: Lives with: lives with their spouse, [name] Lives in: House/apartment Stairs: Yes: Internal: 16 steps; on right going up and External: 2 steps; none Has following equipment at home: None  PLOF: Independent Pt sings for work Teacher, English as a foreign language) -- next gig is supposed to be next week but they had to cancel  PATIENT GOALS: Improve strength and cognition to return to work  OBJECTIVE:   DIAGNOSTIC FINDINGS: [facility] 05/11/22 IMPRESSION: 1. Postop right temporal craniotomy for biopsy. 2. 3 mm left hemispheric subdural hematoma. Mild subdural hematoma along the posterior falx and bilateral tentorium. 3. Lobular extra-axial soft tissue mass in the basilar cisterns appears similar to the prior MRI. 4. These results were called by  telephone at the time of interpretation on 05/11/2022 at 5:12 pm to provider Ostergard , who verbally acknowledged these results.  COGNITION: Overall cognitive status:  reports impaired memory and cognition   SENSATION: WFL "leg feels like rubber"  COORDINATION: Slow with all movements  EDEMA:  None  MUSCLE TONE: None noted  MUSCLE LENGTH: Did not assess  DTRs: Did not assess  POSTURE: Holds head in flexed position, rounded shoulders/thoracic spine due to pain/fatigue  CERVICAL ROM:   Active ROM A/PROM (deg) eval AROM 06/24/22 AROM 07/14/22  Flexion 60 65 65  Extension 20* 40 40  Right lateral flexion 40 50 50  Left lateral flexion 30 40 40  Right rotation 40 55 75  Left rotation 45 65 70   (Blank rows = not tested)  * = pain Feels like head drops to the right  LOWER EXTREMITY ROM:   Grossly WFL  LOWER EXTREMITY MMT:    MMT Right Eval Left Eval Right  06/29/22 Left  06/29/22  Hip flexion 3+ '4 5 5  '$ Hip extension 4- 4- 5 5  Hip abduction 3+ 4- 5 4+  Hip adduction      Hip internal rotation      Hip external rotation      Knee flexion '4 4 5 5  '$ Knee extension '4 4 5 5  '$ Ankle dorsiflexion      Ankle plantarflexion      Ankle inversion      Ankle eversion      (Blank rows = not tested)  UPPER EXTREMITY MMT: grossly 3+/5 bilat  BED MOBILITY: Independent but slow  TRANSFERS: Assistive device utilized: None  Sit to stand: Complete Independence Stand to sit: Complete Independence Chair to chair: Complete Independence Floor:  did not assess   STAIRS: fatigued after strength and balance testing to assess  GAIT: Gait pattern: step through pattern, shuffling, lateral lean- Right, and lateral lean- Left Distance walked: to back of clinic Assistive device utilized: None Level of assistance: Complete Independence Comments: n/a  FUNCTIONAL TESTS:  5 times sit to stand: 22.95 sec Berg Balance Scale: 47/56 Dynamic Gait Index: TBA 1/25: [place]  13/24   PATIENT SURVEYS:  FOTO not appropriate   TODAY'S TREATMENT:    [facility] Adult PT Treatment:                                                DATE: 07/14/2022 Therapeutic Activity: Discussion with patient: Follow-up on performances Management of symptoms Maintaining dual tasking and endurance exercises Brain games   [facility] Adult PT Treatment:                                                DATE: 07/05/2022 Therapeutic Activity: Treadmill: 2.1 mph, incline (varying levels) 0% -- 8% + singing x 15 min Fwd/bkwd amb (1L each) VORx1 Y/P head turns Trampoline: perturbations + EO slow head turns 5"--> 3"--> 1" 11#MB slams + squat pick up + singing 1L Clock yourself: simple clock 80 SPM --> 90 SPM x 1 min each Clock Yourself: brain games 30 SPM x 16mn    [facility] Adult PT Treatment:                                                DATE: 06/29/2022 Therapeutic Activity: Treadmill: 2.0-2.4 mph, intermittent changing incline 0%-5%) + subjective intake & singing x 20 min REO static balance x 181m --> REC static balance x1m108mtandem balance EO & EC x30" each foot  Reaching beyond BOS  Habituation forward bending cone pick up  R/L head turns -> 360 degree turn SLB x30" each   PATIENT EDUCATION: Education details: BriAaron Edelmanmes while walking (dual taking with cognitive component) Person educated: Patient Education method: ExpConsulting civil engineeremMedia plannernd Handouts Education comprehension: verbalized understanding, returned demonstration, and needs further education  HOME EXERCISE PROGRAM: Access Code: [email or web address]: [email or web address] Date: 06/21/2022 Prepared by: KatHelane Guntherxercises - Supine Shoulder Flexion Extension AAROM with [place]el  - 1 x daily - 7 x weekly - 2 sets - 10 reps - Supine Scapular Retraction  - 1 x daily - 7 x weekly - 2 sets - 10 reps - 3 sec hold - Supine Chin Tuck  - 1 x daily - 7 x weekly - 2 sets - 10 reps - 3 sec hold - Supine Bridge  - 1 x daily -  7 x weekly - 2 sets - 10 reps - Clamshell  - 1 x daily - 7 x weekly - 2 sets - 10 reps - Seated Gaze Stabilization with Head Rotation  - 1 x daily - 7 x weekly - 3 sets - 10 reps - Seated Gaze Stabilization with Head Nod  - 3 x daily - 7 x weekly - 3 sets - 10 reps - Seated Gaze Stabilization with Head Nod and Vertical Arm Movement  - 3 x daily - 7 x weekly - 3 sets - 10 reps - Narrow Stance Gaze Stabilization with Two Near Targets and Head Rotation  - 3 x daily - 7 x weekly - 3 sets - 10 reps   GOALS: Goals reviewed with patient? Yes  SHORT TERM GOALS: Target date: 06/23/2022  Pt will be ind with initial HEP Baseline: Goal status: MET 06/24/22  2.  Pt will  demo improved 5x STS to </=15 sec for decreased fall risk Baseline:  Goal status: MET  8 sec 06/24/22  3.  Pt will demo improved cervical ROM for lateral flexion and rotation by >/=10 deg Baseline:  Goal status: MET 06/24/22   LONG TERM GOALS: Target date: 07/21/2022  Pt will be ind with progression and advancement of HEP Baseline:  Goal status: MET on 06/29/22  2.  Pt will have improved Berg Balance Score to >/=52/56 for decreased fall risk Baseline:  Goal status: MET 56/56 on 06/29/22  3.  Pt will improve 5x STS to </=13 sec to decrease fall risk Baseline:  Goal status: MET 8 sec on 06/24/22  4.  Pt will demo enough endurance to get through a full setlist of songs for return to work Baseline:  Goal status: MET on 07/14/22  5.  Pt will report improved headache by >/=50% Baseline:  Goal status: MET on 06/29/22   ASSESSMENT:  CLINICAL IMPRESSION:  Patient has met all the goals as outlined in his POC. 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 Patient has been free of headaches for approximately one month; patient continues to have mild blurred vision, however is able to alleviate symptoms with VOR exercises. Discussion with patient in importance of maintaining  HEP and incorporating dual tasking of singing with cardio to continues progressing cardiovascular endurance. Recommended patient continue engaging in brain games to progress and challenge cognitive conditioning.    OBJECTIVE IMPAIRMENTS: Abnormal gait, decreased activity tolerance, decreased balance, decreased endurance, decreased mobility, difficulty walking, decreased ROM, decreased strength, impaired sensation, impaired UE functional use, improper body mechanics, postural dysfunction, and pain.    PLAN:  PT FREQUENCY: 2x/week  PT DURATION: 8 weeks  PLANNED INTERVENTIONS: Therapeutic exercises, Therapeutic activity, Neuromuscular re-education, Balance training, Gait training, Patient/Family education, Self Care, Joint mobilization, Stair training, Vestibular training, Aquatic Therapy, Dry Needling, Electrical stimulation, Spinal mobilization, Cryotherapy, Moist heat, Taping, Traction, Ionotophoresis '4mg'$ /ml Dexamethasone, Manual therapy, and Re-evaluation  PLAN FOR NEXT SESSION: Discharge Hardin Negus, PTA 07/14/2022, 2:22 PM

## 2022-07-15 ENCOUNTER — Inpatient Hospital Stay: Payer: BC Managed Care – PPO | Attending: Internal Medicine | Admitting: Internal Medicine

## 2022-07-15 ENCOUNTER — Other Ambulatory Visit: Payer: Self-pay

## 2022-07-15 ENCOUNTER — Ambulatory Visit: Payer: BC Managed Care – PPO | Attending: Neurological Surgery

## 2022-07-15 VITALS — BP 127/89 | HR 74 | Temp 97.7°F | Resp 16 | Wt 224.0 lb

## 2022-07-15 DIAGNOSIS — I10 Essential (primary) hypertension: Secondary | ICD-10-CM | POA: Insufficient documentation

## 2022-07-15 DIAGNOSIS — G939 Disorder of brain, unspecified: Secondary | ICD-10-CM | POA: Diagnosis not present

## 2022-07-15 DIAGNOSIS — R41841 Cognitive communication deficit: Secondary | ICD-10-CM | POA: Insufficient documentation

## 2022-07-15 NOTE — Therapy (Signed)
OUTPATIENT SPEECH LANGUAGE PATHOLOGY TREATMENT/DISCHARGE SUMMARY   Patient Name: Sean Carrillo. MRN: [id number] DOB:1966/01/17, 57 y.o., male Today's Date: 07/15/2022  PCP: Trey Sailors PA REFERRING PROVIDER: Judith Part, MD  END OF SESSION:  End of Session - 07/15/22 1113     Visit Number 5    Number of Visits 17    Date for SLP Re-Evaluation 07/29/22    SLP Start Time 1106    SLP Stop Time  1126    SLP Time Calculation (min) 20 min    Activity Tolerance Patient tolerated treatment well              Past Medical History:  Diagnosis Date   Brain tumor ([place])    Hyperlipidemia    Hypertension    Paralysis ([place])    Past Surgical History:  Procedure Laterality Date   APPLICATION OF CRANIAL NAVIGATION N/A 05/10/2022   Procedure: APPLICATION OF CRANIAL NAVIGATION;  Surgeon: Judith Part, MD;  Location: [place];  Service: Neurosurgery;  Laterality: N/A;   Arthroscopic knee surgery     CHOLECYSTECTOMY     CRANIOTOMY Right 05/10/2022   Procedure: Right craniotomy for tumor resection;  Surgeon: Judith Part, MD;  Location: [place];  Service: Neurosurgery;  Laterality: Right;  RM 21   Patient Active Problem List   Diagnosis Date Noted   Bradycardia 05/13/2022   Brain lesion 05/10/2022   Brain mass 03/09/2021   PVFS (postviral fatigue syndrome) 02/10/2021   Syncope 02/10/2021   Chronic insomnia 02/10/2021   Palpitations 04/10/2020   Essential hypertension 04/10/2020   Tear of lateral cartilage or meniscus of knee, current 12/16/2011   SPEECH THERAPY DISCHARGE SUMMARY  Visits from Start of Care: 5  Current functional level related to goals / functional outcomes: See below   Remaining deficits: See below   Education / Equipment: See below.   Patient agrees to discharge. Patient goals were met. Patient is being discharged due to meeting the stated rehab goals..Pt tells SLP that he has returned to near-baseline/baseline with memory  and language skills.      ONSET DATE: 05-10-22   REFERRING DIAG: D49.6 (ICD-10-CM) - Neoplasm of unspecified behavior of brain  THERAPY DIAG:  Cognitive communication deficit  Rationale for Evaluation and Treatment: Rehabilitation  SUBJECTIVE:   SUBJECTIVE STATEMENT: "I can't believe how well it went - better than I planned. 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 Brain surgery 2-3 weeks ago. Pain has been bad since. Has tried pain medicine but has not had a good response. Pt notes increased weakness. Balance has been off as noted by wife. Wife reports pt has not been eating as much. Pt notes R eye is not the best right now. Pt reports difficulty with recall -- worried as this may affect his work.  PAIN:  Are you having pain? No  PATIENT GOALS: "I want to perform again."  OBJECTIVE:  Today's Treatment 07/15/22: "I had one little hiccup on one of the songs but it came right back to me as the band played."  06/22/22: Pt with one session each week for remaining two visits. Pt now remembering items he has put in his phone before phone alerts him. He is using compensations for memory with consistency at this time.  SLP introduced SFA and VNeST  to pt in case he should experience difficulty remembering words as he did last week for "[place]". Pt and SLP proceeded together through one example of each technique and pt free to ask questions, which he did not. Pt recalled "[place]" was the town he could not recall last session, and he talked successfully with a friend about [place] this week using the word without hindrance.  06/16/22: Pt relates he is using  memory compensation of association for meds, his phone for calendar/appointments, and to do lists. Pt has been continuing to practice with You Tube  videos to rehearse movement (for stamina) and song transitions and order. During session pt experienced anomia with "[place]" - SLP cued pt to generate 5 sentences for every word with which he experiences anomia, and SLP wrote LTG for this.  06/09/22: SLP addressed songs pt did not recall details about during the rehearsal, and encouraged him to practice these songs - pt did not write down which songs were problematic so SLP provided pt with pencil/paper and he wrote them down. 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 SLP then talked with pt about med administration - he put alarm in his phone and generated a plan to take his meds in the morning indpendently. Pt spontaneously wrote down he needs to tell wife he is taking over meds and needs her to ensure proper administration for 4-5 days, and to inquire of her if she has seen any other areas that are deficient.  Pt is independent with bill pay (via phone) without skipping/missing/late payments, and tracking appointments (via phone) independently.   PATIENT EDUCATION: Education details: Pt was told to watch old recordings of shows and take notes on the details and also to sing with the shows with his notes, to foster recall of details and sequence. Person educated: Patient Education method: Explanation and Handouts Education comprehension: verbalized understanding   GOALS: Goals reviewed with patient? No  SHORT TERM GOALS: Target date: 06/30/22  Pt will bring memory system to 60% of sessions Baseline: 2/2 Goal status: Ongoing  2.  Pt will demo use/report use of memory system with extended time allowed, in/between 4 sessions Baseline: 06/09/22, 06/16/22 Goal status: Ongoing  3.  Pt will undergo further cognitive linguistic testing Baseline:  Goal status: Deferred    LONG TERM GOALS: Target date: 07/29/22  Pt will score higher/better on PROM than initial  administration Baseline:  Goal status: Deferred - pt back at baseline/near baseline  2.  Pt will complete medication administration tasks with modified independence over two weeks Baseline: 06/16/22 Goal status: Deferred - pt only taking Magnesium (06/22/22)  3.  Pt will utilize memory strategies/system successfully over 3 weeks Baseline: 06/16/22, 06/22/22 Goal status: Met  4.  Pt will report use of linguistic facilitation strategy (e.g., say 5 sentences using word he experienced with anomia, SFA, etc) was successful for subsequent attempts finding that word, x2 sessions Baseline: 06/22/22 Goal status: deferred - pt language is baseline/near baseline  ASSESSMENT:  CLINICAL IMPRESSION: Patient is a 57 y.o. male who was seen today for treatment of cognitive linguistics/memory. He is now using a decr'd amount of memory strategies for both home and vocational situations, and now uses routinely PRN. Performance last week went "better than I planned" and pt feels like memory and verbal expression are very near baseline if they are not yet at baseline.    OBJECTIVE  IMPAIRMENTS: include attention and memory. These impairments are limiting patient from return to work, managing medications, managing appointments, managing finances, household responsibilities, ADLs/IADLs, and effectively communicating at home and in community. Factors affecting potential to achieve goals and functional outcome are co-morbidities and severity of impairments.. Patient will benefit from skilled SLP services to address above impairments and improve overall function.  REHAB POTENTIAL: Good  PLAN:  SLP FREQUENCY: 2x/week  SLP DURATION: 8 weeks  PLANNED INTERVENTIONS: Environmental controls, Cueing hierachy, Cognitive reorganization, Internal/external aids, Functional tasks, SLP instruction and feedback, Compensatory strategies, and Patient/family education    [facility], [place] 07/15/2022, 4:21 PM

## 2022-07-15 NOTE — Progress Notes (Signed)
[facility] [place], [place] [address] [phone]   Interval Evaluation  Date of Service: 07/15/22 Patient Name: Sean Carrillo. Patient MRN: [id number] Patient DOB: 02-Apr-1966 Provider: Ventura Sellers, MD  Identifying Statement:  Sean Carrillo. is a 57 y.o. 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  IDH 1/2 Unknown.  EGFR Unknown  TERT Unknown   Interval History: Sean Carrillo. Presents today following biopsy. He describes notable improvement in short term memory impairment, language expression since the surgery.  Otherwise he remains independent, functional, now back to performing and singing/dancing at a high level.  No further near-LOC episodes.  Headaches are ok.   H+P (03/09/21) Patient presented to neurologic attention in June 2022 with an episode of loss of consciousness.  This was described as "sweaty, tunnel vision, lost of awareness for brief period".  MRI was performed as part of workup 2 months later, which demonstrated multiple masses within the base of the skull.  Since that time, he has not had recurrence of LOC episodes.  That said, he does describe occasional palpitations, and also separate episodes of "feeling funny, kind of like before I went out, but without the blackout".  Frequency is unclear, possibly weekly. Workup has included multiple MRIs, CSF analysis.  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     Medications: Current Outpatient Medications on File Prior to Visit  Medication Sig Dispense Refill   amLODipine (NORVASC) 5 MG tablet Take 1 tablet (5 mg total) by mouth daily. 30 tablet 2   propranolol (INDERAL) 10 MG tablet Take 1 tablet (10 mg total) by mouth 3 (three) times daily as needed  (afib). 90 tablet 2   traZODone (DESYREL) 50 MG tablet Take 1 tablet (50 mg total) by mouth at bedtime as needed for sleep. (Patient taking differently: Take 50 mg by mouth at bedtime.) 90 tablet 1   No current facility-administered medications on file prior to visit.    Allergies: No Known Allergies Past Medical History:  Past Medical History:  Diagnosis Date   Brain tumor ([place])    Hyperlipidemia    Hypertension    Paralysis ([place])    Past Surgical History:  Past Surgical History:  Procedure Laterality Date   APPLICATION OF CRANIAL NAVIGATION N/A 05/10/2022   Procedure: APPLICATION OF CRANIAL NAVIGATION;  Surgeon: Judith Part, MD;  Location: [facility];  Service: Neurosurgery;  Laterality: N/A;   Arthroscopic knee surgery     CHOLECYSTECTOMY     CRANIOTOMY Right 05/10/2022   Procedure: Right craniotomy for tumor resection;  Surgeon: Judith Part, MD;  Location: [facility];  Service: Neurosurgery;  Laterality: Right;  RM 21   Social History:  Social History   Socioeconomic History   Marital status: Married    Spouse name: [name]   Number of children: 2   Years of education: Not on file   Highest education level: Some college, no degree  Occupational History   Not on file  Tobacco Use   Smoking status: Never   Smokeless tobacco: Never  Vaping Use   Vaping Use: Never used  Substance and Sexual Activity   Alcohol use: Never   Drug use: Never   Sexual activity: Yes  Other Topics Concern  Not on file  Social History Narrative   Lives with wife   Caffeine- rare   Social Determinants of Health   Financial Resource Strain: Not on file  Food Insecurity: Not on file  Transportation Needs: Not on file  Physical Activity: Not on file  Stress: Not on file  Social Connections: Not on file  Intimate Partner Violence: Not on file   Family History:  Family History  Problem Relation Age of Onset   Hypertension Mother    Heart attack Father        Age 1    Review  of Systems: Constitutional: Doesn't report fevers, chills or abnormal weight loss Eyes: Doesn't report blurriness of vision Ears, nose, mouth, throat, and face: Doesn't report sore throat Respiratory: Doesn't report cough, dyspnea or wheezes Cardiovascular: Doesn't report palpitation, chest discomfort  Gastrointestinal:  Doesn't report nausea, constipation, diarrhea GU: Doesn't report incontinence Skin: Doesn't report skin rashes Neurological: Per HPI Musculoskeletal: Doesn't report joint pain Behavioral/Psych: Doesn't report anxiety  Physical Exam: Vitals:   07/15/22 1215  BP: 127/89  Pulse: 74  Resp: 16  Temp: 97.7 F (36.5 C)  SpO2: 99%   KPS: 90. General: Alert, cooperative, pleasant, in no acute distress Head: Normal EENT: No conjunctival injection or scleral icterus.  Lungs: Resp effort normal Cardiac: Regular rate Abdomen: Non-distended abdomen Skin: No rashes cyanosis or petechiae. Extremities: No clubbing or edema  Neurologic Exam: Mental Status: Awake, alert, attentive to examiner. Oriented to self and environment. Language is fluent with intact comprehension.  Cranial Nerves: Visual acuity is grossly normal. Visual fields are full. Extra-ocular movements intact. No ptosis. Face is symmetric Motor: Tone and bulk are normal. Power is full in both arms and legs. Reflexes are symmetric, no pathologic reflexes present.  Sensory: Intact to light touch Gait: Normal.   Labs: I have reviewed the data as listed    Component Value Date/Time   NA 144 05/10/2022 1059   K 3.6 05/10/2022 1059   CL 108 04/28/2022 1440   CO2 26 04/28/2022 1440   GLUCOSE 88 04/28/2022 1440   BUN 13 04/28/2022 1440   CREATININE 0.99 05/10/2022 1620   CALCIUM 8.8 (L) 04/28/2022 1440   GFRNONAA >60 05/10/2022 1620   Lab Results  Component Value Date   WBC 9.3 05/10/2022   NEUTROABS 3.2 10/24/2020   HGB 13.5 05/10/2022   HCT 41.6 05/10/2022   MCV 90.4 05/10/2022   PLT 331 05/10/2022     Carrillo:  SURGICAL Carrillo * THIS IS AN ADDENDUM REPORT * CASE: [id number] PATIENT: Sean Carrillo Report *Addendum *  Reason for Addendum #1:  Additional special stains  Clinical History: Brain tumor (nt)   "Per Dr. Zada Finders, bbx includes IgG4 disease"  FINAL MICROSCOPIC DIAGNOSIS:  A. BRAIN, SKULL BASE TUMOR, EXCISION: Dense fibroconnective tissue and fibrovascular stroma with marked chronic inflammation  COMMENT:  Sections show dense fibrotic hyalinized and fibrovascular stroma with a diffuse chronic inflammatory cell infiltrate comprised of lymphocytes and plasma cells. Two immunohistochemical stains are performed with adequate control.  The B-cell marker CD20 and T-cell marker CD3 exhibit a mixed population of lymphocytes, predominantly T cells.  Given the differential diagnosis of IgG4 disease, immunohistochemical stains for IgG and IgG4 have been ordered as a send out and these results will be reported within an addendum.   INTRAOPERATIVE CONSULTATION:  A.  Skull base tumor: "Mononuclear cell infiltrate, favor inflammation." Intraoperative diagnosis rendered by Dr. Saralyn Pilar at 11:26 AM on 05/10/2022.  GROSS DESCRIPTION:  Received fresh is a 1.1 x 0.7 x 0.3 cm fragment of pink-tan soft tissue. The specimen is entirely submitted for frozen section and subsequently transferred to A1 for routine histology. (KW, 05/10/2022)  Final Diagnosis performed by Tobin Chad, MD.   Electronically signed 05/13/2022     Assessment/Plan Brain lesion  [place].. Is clinically stable today, now having completed biopsy of dural based lesion.  Path demonstrates non-specific inflammation rather than dural based neoplasm, as had been suspected.  Based on clinical and functional stability, we did not recommend steroids or immune-suppression at this time.  We did discuss referral to neuro-immunology at [facility]; he is agreeable with  this.  Other option would be to continue surveillance as prior, and repeat MRI brain in 2-3 months.  All questions were answered. The patient knows to call the clinic with any problems, questions or concerns. No barriers to learning were detected.  The total time spent in the encounter was 40 minutes and more than 50% was on counseling and review of test results   Ventura Sellers, MD Medical Director of Neuro-Oncology [facility] [place] 07/15/22 2:44 PM

## 2022-07-20 ENCOUNTER — Other Ambulatory Visit: Payer: Self-pay | Admitting: Neurology

## 2022-07-20 ENCOUNTER — Ambulatory Visit: Payer: BC Managed Care – PPO

## 2022-07-24 DIAGNOSIS — N4830 Priapism, unspecified: Secondary | ICD-10-CM | POA: Diagnosis not present

## 2022-07-28 NOTE — Progress Notes (Deleted)
Cardiology Clinic Note   Patient Name: Sean Carrillo. Date of Encounter: 07/28/2022  Primary Care Provider:  Trey Sailors, PA Primary Cardiologist:  Kirk Ruths, MD  Patient Profile      57 y.o. male with a hx of palpitations, HTN, sinus bradycardia, and brain mass who is being seen 05/13/2022 for the evaluation of syncope and bradycardia by cardiology. Heart monitor at that time showed sinus rhythm and no significant arrhythmias, AV block, or pauses. Echo 2021 with normal BiV functio and no significant valvular disease. Coreg started and helped with palpitations.  Head CTA with irregular density in the parasellar region and prepontine cistern. Had craniotomy on 05/10/22 Path demonstrates non-specific inflammation rather than dural based neoplasm. On last office visit he was having some confusion on dosages of coreg, and was taking this PRN for palpitations. He was restarted on propanolol and coreg was discontinued.   Past Medical History    Past Medical History:  Diagnosis Date   Brain tumor ([place])    Hyperlipidemia    Hypertension    Paralysis ([facility] [place])    Past Surgical History:  Procedure Laterality Date   APPLICATION OF CRANIAL NAVIGATION N/A 05/10/2022   Procedure: APPLICATION OF CRANIAL NAVIGATION;  Surgeon: Judith Part, MD;  Location: [facility];  Service: Neurosurgery;  Laterality: N/A;   Arthroscopic knee surgery     CHOLECYSTECTOMY     CRANIOTOMY Right 05/10/2022   Procedure: Right craniotomy for tumor resection;  Surgeon: Judith Part, MD;  Location: [facility];  Service: Neurosurgery;  Laterality: Right;  RM 21    Allergies  No Known Allergies  History of Present Illness    Mr. Dufrene comes today for ongoing assessment and management of palpitations and hypertension. He was placed on prn propanolol 10 mg TID and amlodipine 5 mg daily.He was to be planned for home sleep study.   Home Medications    Current Outpatient Medications  Medication Sig  Dispense Refill   amLODipine (NORVASC) 5 MG tablet Take 1 tablet (5 mg total) by mouth daily. 30 tablet 2   propranolol (INDERAL) 10 MG tablet Take 1 tablet (10 mg total) by mouth 3 (three) times daily as needed (afib). 90 tablet 2   traZODone (DESYREL) 50 MG tablet Take 1 tablet (50 mg total) by mouth at bedtime as needed for sleep. (Patient taking differently: Take 50 mg by mouth at bedtime.) 90 tablet 1   No current facility-administered medications for this visit.     Family History    Family History  Problem Relation Age of Onset   Hypertension Mother    Heart attack Father        Age 75   He indicated that his mother is alive. He indicated that his father is deceased. He indicated that his sister is alive.  Social History    Social History   Socioeconomic History   Marital status: Married    Spouse name: [name]   Number of children: 2   Years of education: Not on file   Highest education level: Some college, no degree  Occupational History   Not on file  Tobacco Use   Smoking status: Never   Smokeless tobacco: Never  Vaping Use   Vaping Use: Never used  Substance and Sexual Activity   Alcohol use: Never   Drug use: Never   Sexual activity: Yes  Other Topics Concern   Not on file  Social History Narrative   Lives with wife  Caffeine- rare   Social Determinants of Health   Financial Resource Strain: Not on file  Food Insecurity: Not on file  Transportation Needs: Not on file  Physical Activity: Not on file  Stress: Not on file  Social Connections: Not on file  Intimate Partner Violence: Not on file     Review of Systems    General:  No chills, fever, night sweats or weight changes.  Cardiovascular:  No chest pain, dyspnea on exertion, edema, orthopnea, palpitations, paroxysmal nocturnal dyspnea. Dermatological: No rash, lesions/masses Respiratory: No cough, dyspnea Urologic: No hematuria, dysuria Abdominal:   No nausea, vomiting, diarrhea, bright  red blood per rectum, melena, or hematemesis Neurologic:  No visual changes, wkns, changes in mental status. All other systems reviewed and are otherwise negative except as noted above.     Physical Exam    VS:  There were no vitals taken for this visit. , BMI There is no height or weight on file to calculate BMI. STOP-Bang Score:  7  { Consider Dx Sleep Disordered Breathing or Sleep Apnea  ICD G47.33          :1}    GEN: Well nourished, well developed, in no acute distress. HEENT: normal. Neck: Supple, no JVD, carotid bruits, or masses. Cardiac: RRR, no murmurs, rubs, or gallops. No clubbing, cyanosis, edema.  Radials/DP/PT 2+ and equal bilaterally.  Respiratory:  Respirations regular and unlabored, clear to auscultation bilaterally. GI: Soft, nontender, nondistended, BS + x 4. MS: no deformity or atrophy. Skin: warm and dry, no rash. Neuro:  Strength and sensation are intact. Psych: Normal affect.  Accessory Clinical Findings    ECG personally reviewed by me today- *** - No acute changes  Lab Results  Component Value Date   WBC 9.3 05/10/2022   HGB 13.5 05/10/2022   HCT 41.6 05/10/2022   MCV 90.4 05/10/2022   PLT 331 05/10/2022   Lab Results  Component Value Date   CREATININE 0.99 05/10/2022   BUN 13 04/28/2022   NA 144 05/10/2022   K 3.6 05/10/2022   CL 108 04/28/2022   CO2 26 04/28/2022   No results found for: "ALT", "AST", "GGT", "ALKPHOS", "BILITOT" No results found for: "CHOL", "HDL", "LDLCALC", "LDLDIRECT", "TRIG", "CHOLHDL"  Lab Results  Component Value Date   HGBA1C 4.9 10/28/2020    Review of Prior Studies: Zio Monitor 06/07/2022 Patch Wear Time:  1 days and 18 hours (2024-01-25T13:03:54-0500 to 2024-01-27T07:46:16-0500)   Patient had a min HR of 56 bpm, max HR of 135 bpm, and avg HR of 77 bpm. Predominant underlying rhythm was Sinus Rhythm. 2 Supraventricular Tachycardia runs occurred, the run with the fastest interval lasting 4 beats with a max rate  of 135 bpm, the  longest lasting 4 beats with an avg rate of 119 bpm. Isolated SVEs were rare (<1.0%), and no SVE Couplets or SVE Triplets were present. Isolated VEs were rare (<1.0%), VE Couplets were rare (<1.0%), and no VE Triplets were present. Inverted QRS complexes  possibly due to inverted placement of device.   Sinus bradycardia, normal sinus rhythm, sinus tachycardia, occasional PAC, 4 beats PAT, rare PVC and couplet. Kirk Ruths, MD   Echo 01/2021: 1. Left ventricular ejection fraction, by estimation, is 60 to 65%. The  left ventricle has normal function. The left ventricle has no regional  wall motion abnormalities. There is mild concentric left ventricular  hypertrophy. Left ventricular diastolic  parameters were normal.   2. Right ventricular systolic function is normal. The right  ventricular  size is normal. Tricuspid regurgitation signal is inadequate for assessing  PA pressure.   3. The mitral valve is normal in structure. Trivial mitral valve  regurgitation. No evidence of mitral stenosis.   4. The aortic valve is normal in structure. Aortic valve regurgitation is  not visualized. No aortic stenosis is present.   Assessment & Plan   1.  ***     {Are you ordering a CV Procedure (e.g. stress test, cath, DCCV, TEE, etc)?   Press F2        :[id number]   Signed, Phill Myron. [place] Pugh, ANP, AACC   07/28/2022 1:15 PM      Office [phone] Fax [phone]  Notice: This dictation was prepared with Dragon dictation along with smaller phrase technology. Any transcriptional errors that result from this process are unintentional and may not be corrected upon review.

## 2022-07-29 ENCOUNTER — Ambulatory Visit: Payer: BC Managed Care – PPO

## 2022-07-30 ENCOUNTER — Ambulatory Visit: Payer: BC Managed Care – PPO | Admitting: Adult Health

## 2022-08-10 ENCOUNTER — Ambulatory Visit (HOSPITAL_BASED_OUTPATIENT_CLINIC_OR_DEPARTMENT_OTHER): Payer: BC Managed Care – PPO | Attending: Adult Health | Admitting: Cardiovascular Disease

## 2022-08-10 VITALS — Ht 69.0 in | Wt 224.0 lb

## 2022-08-10 DIAGNOSIS — R002 Palpitations: Secondary | ICD-10-CM | POA: Diagnosis not present

## 2022-08-10 DIAGNOSIS — G4733 Obstructive sleep apnea (adult) (pediatric): Secondary | ICD-10-CM | POA: Diagnosis not present

## 2022-08-10 DIAGNOSIS — R001 Bradycardia, unspecified: Secondary | ICD-10-CM | POA: Diagnosis not present

## 2022-08-19 ENCOUNTER — Ambulatory Visit: Payer: BC Managed Care – PPO | Admitting: Nurse Practitioner

## 2022-08-25 NOTE — Progress Notes (Signed)
HPI: Follow-up palpitations and syncope. Monitor June 2021 showed sinus rhythm with 3 and 4 beat runs of nonsustained ventricular tachycardia but no other significant arrhythmia.  Seen with syncope in the emergency room 6/22.  Echocardiogram September 2022 showed normal LV function, mild left ventricular hypertrophy.  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  Monitor February 2024 showed sinus rhythm with occasional PAC, brief PAT, rare PVC and a couplet.  Since last seen he does have occasional "spells".  Some of these have occurred while driving.  He suddenly feels lightheaded with chills and tunnel vision.  He has not had syncope.  There is associated palpitations but no dyspnea or chest pain and no nausea.  Symptoms last 20 to 30 minutes and resolved.  He otherwise denies dyspnea on exertion, orthopnea, PND, pedal edema, exertional chest pain.  Current Outpatient Medications  Medication Sig Dispense Refill   amLODipine (NORVASC) 5 MG tablet Take 1 tablet (5 mg total) by mouth daily. 30 tablet 2   propranolol (INDERAL) 10 MG tablet Take 1 tablet (10 mg total) by mouth 3 (three) times daily as needed (afib). 90 tablet 2   traZODone (DESYREL) 50 MG tablet Take 1 tablet (50 mg total) by mouth at bedtime as needed for sleep. (Patient taking differently: Take 50 mg by mouth at bedtime.) 90 tablet 1   No current facility-administered medications for this visit.     Past Medical History:  Diagnosis Date   Brain tumor ([facility])    Hyperlipidemia    Hypertension    Paralysis ([facility])     Past Surgical History:  Procedure Laterality Date   APPLICATION OF CRANIAL NAVIGATION N/A 05/10/2022   Procedure: APPLICATION OF CRANIAL NAVIGATION;  Surgeon: Jadene Pierini, MD;  [facility];  Service: Neurosurgery;  Laterality: N/A;   Arthroscopic knee surgery     CHOLECYSTECTOMY     CRANIOTOMY Right 05/10/2022   Procedure: Right craniotomy for tumor resection;  Surgeon:  Jadene Pierini, MD;  Location: [facility];  Service: Neurosurgery;  Laterality: Right;  RM 21    Social History   Socioeconomic History   Marital status: Married    Spouse name: Joni Reining   Number of children: 2   Years of education: Not on file   Highest education level: Some college, no degree  Occupational History   Not on file  Tobacco Use   Smoking status: Never   Smokeless tobacco: Never  Vaping Use   Vaping Use: Never used  Substance and Sexual Activity   Alcohol use: Never   Drug use: Never   Sexual activity: Yes  Other Topics Concern   Not on file  Social History Narrative   Lives with wife   Caffeine- rare   Social Determinants of Health   Financial Resource Strain: Not on file  Food Insecurity: Not on file  Transportation Needs: Not on file  Physical Activity: Not on file  Stress: Not on file  Social Connections: Not on file  Intimate Partner Violence: Not on file    Family History  Problem Relation Age of Onset   Hypertension Mother    Heart attack Father        Age 64    ROS: no fevers or chills, productive cough, hemoptysis, dysphasia, odynophagia, melena, hematochezia, dysuria, hematuria, rash, seizure activity, orthopnea, PND, pedal edema, claudication. Remaining systems are negative.  Physical Exam: Well-developed well-nourished in no acute distress.  Skin is warm and dry.  HEENT is normal.  Neck is supple.  Chest is clear to auscultation with normal expansion.  Cardiovascular exam is regular rate and rhythm.  Abdominal exam nontender or distended. No masses palpated. Extremities show no edema. neuro grossly intact  A/P  1 history of syncope-echocardiogram previously showed normal LV function.  He continues to have occasional "spells".  Etiology is unclear.  They last up to 20 to 30 minutes.  He does have a smart watch.  I have asked him to forward any rhythm strips to [place] to review associated with spells.  2 history of palpitations-continue  beta-blocker at present dose.  Most recent monitor showed PACs and PVCs.  3 hypertension-blood pressure is elevated.  Increase amlodipine to 10 mg daily and follow-up.  4 hyperlipidemia-Per primary care.  He does have a family history of coronary disease.  I will arrange a calcium score and he will need statin therapy if elevated.  Olga Millers, MD

## 2022-08-27 ENCOUNTER — Encounter (HOSPITAL_BASED_OUTPATIENT_CLINIC_OR_DEPARTMENT_OTHER): Payer: Self-pay | Admitting: Cardiovascular Disease

## 2022-08-27 NOTE — Procedures (Signed)
      Patient Name: Sean Carrillo, Sean Carrillo Date: 08/10/2022 Gender: Male D.O.B: 09-24-1965 Age (years): 42 Referring Provider: Joni Reining NP Height (inches): 68 Interpreting Physician: Nicki Guadalajara MD, ABSM Weight (lbs): 218 RPSGT: Edgard Sink BMI: 33 MRN: [id number] Neck Size: 17.50  CLINICAL INFORMATION Sleep Study Type: HST  Indication for sleep study: STOP-Bang 7; daytime sleepiness  Epworth Sleepiness Score: 12  SLEEP STUDY TECHNIQUE A multi-channel overnight portable sleep study was performed. The channels recorded were: nasal airflow, thoracic respiratory movement, and oxygen saturation with a pulse oximetry. Snoring was also monitored.  MEDICATIONS amLODipine (NORVASC) 5 MG tablet propranolol (INDERAL) 10 MG tablet traZODone (DESYREL) 50 MG tablet Patient self administered medications include: N/A.  SLEEP ARCHITECTURE Patient was studied for 376.5 minutes. The sleep efficiency was 100.0 % and the patient was supine for 0%. The arousal index was 0.0 per hour.  RESPIRATORY PARAMETERS The overall AHI was 9.4 per hour, with a central apnea index of 0 per hour.  The oxygen nadir was 86% during sleep.  CARDIAC DATA Mean heart rate during sleep was 66.7 bpm. Heart rate range: 53 - 108.  IMPRESSIONS - Mild obstructive sleep apnea occurred during this study (AHI 9.4/h). There is a positional component with supine sleep AHI 11.0/h versus non-supine sleep AHI 3.2/h. - Moderate oxygen desaturation was noted during this study (Min O2 86%). - Patient snored 159.8 minutes (42.4%) during the sleep.  DIAGNOSIS - Obstructive Sleep Apnea (G47.33)  RECOMMENDATIONS - Therapeutic CPAP titration to determine optimal pressure required to alleviate sleep disordered breathing. Can initiate a trial of Auto-PAP therapy with EPR of 2 at 6 - 15 cm of water.  Alternative therapy with a customized oral appliance may be considered. - Effort should be made to optimize nasal  and oropharyngeal patency. - Positional therapy avoiding supine position during sleep. - Avoid alcohol, sedatives and other CNS depressants that may worsen sleep apnea and disrupt normal sleep architecture. - Sleep hygiene should be reviewed to assess factors that may improve sleep quality. - Weight management and regular exercise should be initiated or continued. - If CPAP is initiated recommend a download and sleep clinic evaluation after 4 - 6 weeks of therapy.   [Electronically signed] 08/27/2022 10:51 AM  Nicki Guadalajara MD, [facility], ABSM Diplomate, American Board of Sleep Medicine  NPI: [id number]  [facility] PH: [phone]   FX: [phone] ACCREDITED BY THE AMERICAN ACADEMY OF SLEEP MEDICINE

## 2022-08-30 DIAGNOSIS — E782 Mixed hyperlipidemia: Secondary | ICD-10-CM | POA: Diagnosis not present

## 2022-08-30 DIAGNOSIS — R002 Palpitations: Secondary | ICD-10-CM | POA: Diagnosis not present

## 2022-08-30 DIAGNOSIS — F5104 Psychophysiologic insomnia: Secondary | ICD-10-CM | POA: Diagnosis not present

## 2022-08-30 DIAGNOSIS — F419 Anxiety disorder, unspecified: Secondary | ICD-10-CM | POA: Diagnosis not present

## 2022-08-31 ENCOUNTER — Ambulatory Visit: Payer: BC Managed Care – PPO | Attending: Cardiology | Admitting: Cardiology

## 2022-08-31 ENCOUNTER — Encounter: Payer: Self-pay | Admitting: Cardiology

## 2022-08-31 VITALS — BP 148/90 | HR 71 | Ht 69.0 in | Wt 221.2 lb

## 2022-08-31 DIAGNOSIS — R55 Syncope and collapse: Secondary | ICD-10-CM

## 2022-08-31 DIAGNOSIS — I10 Essential (primary) hypertension: Secondary | ICD-10-CM

## 2022-08-31 DIAGNOSIS — Z136 Encounter for screening for cardiovascular disorders: Secondary | ICD-10-CM

## 2022-08-31 DIAGNOSIS — R002 Palpitations: Secondary | ICD-10-CM | POA: Diagnosis not present

## 2022-08-31 MED ORDER — AMLODIPINE BESYLATE 10 MG PO TABS: 10.0000 mg | ORAL_TABLET | Freq: Every day | ORAL | 3 refills | Status: DC

## 2022-08-31 NOTE — Patient Instructions (Signed)
Medication Instructions:   INCREASE AMLODIPINE TO 10 MG ONCE DAILY= 2 OF THE 5 MG TABLETS ONCE DAILY  *If you need a refill on your cardiac medications before your next appointment, please call your pharmacy*  Testing/Procedures:  CORONARY CALCIUM SCORING CT SCAN AT THE [facility] OFFICE   Follow-Up: At [facility], you and your health needs are our priority.  As part of our continuing mission to provide you with exceptional heart care, we have created designated Provider Care Teams.  These Care Teams include your primary Cardiologist (physician) and Advanced Practice Providers (APPs -  Physician Assistants and Nurse Practitioners) who all work together to provide you with the care you need, when you need it.  We recommend signing up for the patient portal called "MyChart".  Sign up information is provided on this After Visit Summary.  MyChart is used to connect with patients for Virtual Visits (Telemedicine).  Patients are able to view lab/test results, encounter notes, upcoming appointments, etc.  Non-urgent messages can be sent to your provider as well.   To learn more about what you can do with MyChart, go to [email or web address].    Your next appointment:   6 month(s)  Provider:   Olga Millers, MD

## 2022-09-02 ENCOUNTER — Telehealth: Payer: Self-pay | Admitting: *Deleted

## 2022-09-02 NOTE — Telephone Encounter (Signed)
-----   Message from Lennette Bihari, MD sent at 08/27/2022 10:57 AM EDT ----- Sean Carrillo, please notify pt of results.  Can initiate Auto-PAP unless oral appliance alternative

## 2022-09-02 NOTE — Telephone Encounter (Signed)
Patient notified of sleep study results and recommendations. He wants to discuss the treatment options with his wife and call back with a decision.

## 2022-10-22 ENCOUNTER — Other Ambulatory Visit (HOSPITAL_BASED_OUTPATIENT_CLINIC_OR_DEPARTMENT_OTHER): Payer: BC Managed Care – PPO

## 2023-01-05 ENCOUNTER — Other Ambulatory Visit (HOSPITAL_BASED_OUTPATIENT_CLINIC_OR_DEPARTMENT_OTHER): Payer: BC Managed Care – PPO

## 2023-01-18 ENCOUNTER — Encounter: Payer: Self-pay | Admitting: Internal Medicine

## 2023-01-19 ENCOUNTER — Telehealth: Payer: Self-pay | Admitting: *Deleted

## 2023-01-19 NOTE — Telephone Encounter (Signed)
Contacted patient by phone to follow up on Mychart message: "Wanted to know if I could get my release papers for my insurance company" Patient stated his new insurance company asked him to get the discharge papers from his surgery or when he was in the hospital - he wasn't exactly sure which it was.  Advised patient that Dr. Barbaraann Cao has not discharged him and cannot provide that information. He or his insurance company could possibly contact the neurosurgeon's office for discharge information. His insurance company could also contact [facility] for any medical records needed related to his hospitalization. He verbalized understanding of information

## 2023-02-03 NOTE — Progress Notes (Signed)
HPI: Follow-up palpitations and syncope. Monitor June 2021 showed sinus rhythm with 3 and 4 beat runs of nonsustained ventricular tachycardia but no other significant arrhythmia.  Seen with syncope in the emergency room 6/22.  Echocardiogram September 2022 showed normal LV function, mild left ventricular hypertrophy.  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  Monitor February 2024 showed sinus rhythm with occasional PAC, brief PAT, rare PVC and a couplet.  Calcium score October 2024 0.  Since last seen he continues to have occasional palpitations that are worse at times.  He has not had syncope.  No dyspnea on exertion or chest pain.  Current Outpatient Medications  Medication Sig Dispense Refill   amLODipine (NORVASC) 10 MG tablet Take 1 tablet (10 mg total) by mouth daily. 90 tablet 3   propranolol (INDERAL) 10 MG tablet Take 1 tablet (10 mg total) by mouth 3 (three) times daily as needed (afib). 90 tablet 2   traZODone (DESYREL) 50 MG tablet Take 1 tablet (50 mg total) by mouth at bedtime as needed for sleep. (Patient taking differently: Take 50 mg by mouth at bedtime.) 90 tablet 1   No current facility-administered medications for this visit.     Past Medical History:  Diagnosis Date   Brain tumor (HCC)    Hyperlipidemia    Hypertension    Paralysis (HCC)     Past Surgical History:  Procedure Laterality Date   APPLICATION OF CRANIAL NAVIGATION N/A 05/10/2022   Procedure: APPLICATION OF CRANIAL NAVIGATION;  Surgeon: Jadene Pierini, MD;  Location: [facility] OR;  Service: Neurosurgery;  Laterality: N/A;   Arthroscopic knee surgery     CHOLECYSTECTOMY     CRANIOTOMY Right 05/10/2022   Procedure: Right craniotomy for tumor resection;  Surgeon: Jadene Pierini, MD;  Location: [facility] OR;  Service: Neurosurgery;  Laterality: Right;  RM 21    Social History   Socioeconomic History   Marital status: Married    Spouse name: Joni Reining   Number of children: 2    Years of education: Not on file   Highest education level: Some college, no degree  Occupational History   Not on file  Tobacco Use   Smoking status: Never   Smokeless tobacco: Never  Vaping Use   Vaping status: Never Used  Substance and Sexual Activity   Alcohol use: Never   Drug use: Never   Sexual activity: Yes  Other Topics Concern   Not on file  Social History Narrative   Lives with wife   Caffeine- rare   Social Determinants of Health   Financial Resource Strain: Not on file  Food Insecurity: Not on file  Transportation Needs: Not on file  Physical Activity: Not on file  Stress: Not on file  Social Connections: Not on file  Intimate Partner Violence: Not on file    Family History  Problem Relation Age of Onset   Hypertension Mother    Heart attack Father        Age 7    ROS: no fevers or chills, productive cough, hemoptysis, dysphasia, odynophagia, melena, hematochezia, dysuria, hematuria, rash, seizure activity, orthopnea, PND, pedal edema, claudication. Remaining systems are negative.  Physical Exam: Well-developed well-nourished in no acute distress.  Skin is warm and dry.  HEENT is normal.  Neck is supple.  Chest is clear to auscultation with normal expansion.  Cardiovascular exam is regular rate and rhythm.  Abdominal exam nontender or distended. No masses palpated. Extremities show  no edema. neuro grossly intact  ECG- personally reviewed  A/P  1 history of syncope-prior echocardiogram showed preserved LV function.  I have again asked him to forward any rhythm strips associated with his episodes from his smart watch.  2 hypertension-patient's blood pressure is controlled.  Continue present medical regimen.  3 palpitations-continues to have intermittent symptoms.  Increase Inderal to 10 mg twice daily with an additional 10 mg daily as needed.  4 history of hyperlipidemia-calcium score 0.  Continue diet.  Olga Millers, MD

## 2023-02-09 ENCOUNTER — Ambulatory Visit (HOSPITAL_BASED_OUTPATIENT_CLINIC_OR_DEPARTMENT_OTHER)
Admission: RE | Admit: 2023-02-09 | Discharge: 2023-02-09 | Disposition: A | Discharge status: Home / Self Care | Payer: BC Managed Care – PPO | Source: Ambulatory Visit | Attending: Cardiology | Admitting: Cardiology

## 2023-02-09 DIAGNOSIS — Z136 Encounter for screening for cardiovascular disorders: Secondary | ICD-10-CM | POA: Insufficient documentation

## 2023-02-14 ENCOUNTER — Encounter: Payer: Self-pay | Admitting: *Deleted

## 2023-02-17 ENCOUNTER — Encounter: Payer: Self-pay | Admitting: Cardiology

## 2023-02-17 ENCOUNTER — Ambulatory Visit: Payer: BC Managed Care – PPO | Attending: Cardiology | Admitting: Cardiology

## 2023-02-17 VITALS — BP 126/80 | HR 73 | Ht 69.0 in | Wt 228.0 lb

## 2023-02-17 DIAGNOSIS — R55 Syncope and collapse: Secondary | ICD-10-CM | POA: Diagnosis not present

## 2023-02-17 DIAGNOSIS — I10 Essential (primary) hypertension: Secondary | ICD-10-CM | POA: Diagnosis not present

## 2023-02-17 DIAGNOSIS — R002 Palpitations: Secondary | ICD-10-CM | POA: Diagnosis not present

## 2023-02-17 MED ORDER — PROPRANOLOL HCL 10 MG PO TABS: 10.0000 mg | ORAL_TABLET | Freq: Two times a day (BID) | ORAL | 2 refills | Status: DC

## 2023-02-17 MED ORDER — TRAZODONE HCL 50 MG PO TABS: 50.0000 mg | ORAL_TABLET | Freq: Every evening | ORAL | 1 refills | Status: AC | PRN

## 2023-02-17 NOTE — Patient Instructions (Signed)
Medication Instructions:  Increase Inderal 10 mg twice daily take an additional 10 mg as needed Refill Trazodone  *If you need a refill on your cardiac medications before your next appointment, please call your pharmacy*   Lab Work: None  If you have labs (blood work) drawn today and your tests are completely normal, you will receive your results only by: MyChart Message (if you have MyChart) OR A paper copy in the mail If you have any lab test that is abnormal or we need to change your treatment, we will call you to review the results.   Testing/Procedures: None    Follow-Up: At [facility], you and your health needs are our priority.  As part of our continuing mission to provide you with exceptional heart care, we have created designated Provider Care Teams.  These Care Teams include your primary Cardiologist (physician) and Advanced Practice Providers (APPs -  Physician Assistants and Nurse Practitioners) who all work together to provide you with the care you need, when you need it.  We recommend signing up for the patient portal called "MyChart".  Sign up information is provided on this After Visit Summary.  MyChart is used to connect with patients for Virtual Visits (Telemedicine).  Patients are able to view lab/test results, encounter notes, upcoming appointments, etc.  Non-urgent messages can be sent to your provider as well.   To learn more about what you can do with MyChart, go to [email or web address].    Your next appointment:   1 year(s)  Provider:   Olga Millers, MD

## 2023-03-02 DIAGNOSIS — Z125 Encounter for screening for malignant neoplasm of prostate: Secondary | ICD-10-CM | POA: Diagnosis not present

## 2023-03-02 DIAGNOSIS — D751 Secondary polycythemia: Secondary | ICD-10-CM | POA: Diagnosis not present

## 2023-03-02 DIAGNOSIS — Z131 Encounter for screening for diabetes mellitus: Secondary | ICD-10-CM | POA: Diagnosis not present

## 2023-03-02 DIAGNOSIS — E782 Mixed hyperlipidemia: Secondary | ICD-10-CM | POA: Diagnosis not present

## 2023-03-02 DIAGNOSIS — I10 Essential (primary) hypertension: Secondary | ICD-10-CM | POA: Diagnosis not present

## 2023-03-02 DIAGNOSIS — E669 Obesity, unspecified: Secondary | ICD-10-CM | POA: Diagnosis not present

## 2023-03-02 DIAGNOSIS — Z0000 Encounter for general adult medical examination without abnormal findings: Secondary | ICD-10-CM | POA: Diagnosis not present

## 2023-07-12 ENCOUNTER — Other Ambulatory Visit: Payer: Self-pay

## 2023-07-12 ENCOUNTER — Other Ambulatory Visit: Payer: Self-pay | Admitting: Adult Health

## 2023-07-12 DIAGNOSIS — I10 Essential (primary) hypertension: Secondary | ICD-10-CM

## 2023-07-12 MED ORDER — PROPRANOLOL HCL 10 MG PO TABS: 10.0000 mg | ORAL_TABLET | Freq: Two times a day (BID) | ORAL | 2 refills | Status: AC

## 2023-08-11 ENCOUNTER — Other Ambulatory Visit: Payer: Self-pay | Admitting: Cardiology

## 2023-08-11 DIAGNOSIS — I10 Essential (primary) hypertension: Secondary | ICD-10-CM

## 2023-09-20 ENCOUNTER — Other Ambulatory Visit: Payer: Self-pay | Admitting: *Deleted

## 2023-09-20 DIAGNOSIS — G939 Disorder of brain, unspecified: Secondary | ICD-10-CM

## 2023-09-20 NOTE — Progress Notes (Signed)
 Patient called to report new pain in his head that started 3 weeks ago.  No medications are helping.  He is over due for MRI.   MRI order added.  Will set up follow up to see Dr Mark Sil.

## 2023-09-22 ENCOUNTER — Ambulatory Visit (HOSPITAL_COMMUNITY)

## 2023-10-04 ENCOUNTER — Other Ambulatory Visit (HOSPITAL_COMMUNITY)

## 2023-10-21 DIAGNOSIS — Z125 Encounter for screening for malignant neoplasm of prostate: Secondary | ICD-10-CM | POA: Diagnosis not present

## 2023-10-21 DIAGNOSIS — I10 Essential (primary) hypertension: Secondary | ICD-10-CM | POA: Diagnosis not present

## 2023-10-21 DIAGNOSIS — F5104 Psychophysiologic insomnia: Secondary | ICD-10-CM | POA: Diagnosis not present

## 2023-10-21 DIAGNOSIS — Z131 Encounter for screening for diabetes mellitus: Secondary | ICD-10-CM | POA: Diagnosis not present

## 2023-10-21 DIAGNOSIS — E782 Mixed hyperlipidemia: Secondary | ICD-10-CM | POA: Diagnosis not present

## 2023-10-21 DIAGNOSIS — R051 Acute cough: Secondary | ICD-10-CM | POA: Diagnosis not present

## 2023-10-21 DIAGNOSIS — B372 Candidiasis of skin and nail: Secondary | ICD-10-CM | POA: Diagnosis not present

## 2023-12-31 IMAGING — CR DG LUMBAR SPINE COMPLETE 4+V
5 series · 5 of 5 positions shown · non-contrast
Comparison: None.

CLINICAL DATA: Lower back pain.  Right hip pain.

EXAM:
LUMBAR SPINE - COMPLETE 4+ VIEW

[t l-spine a.p.]
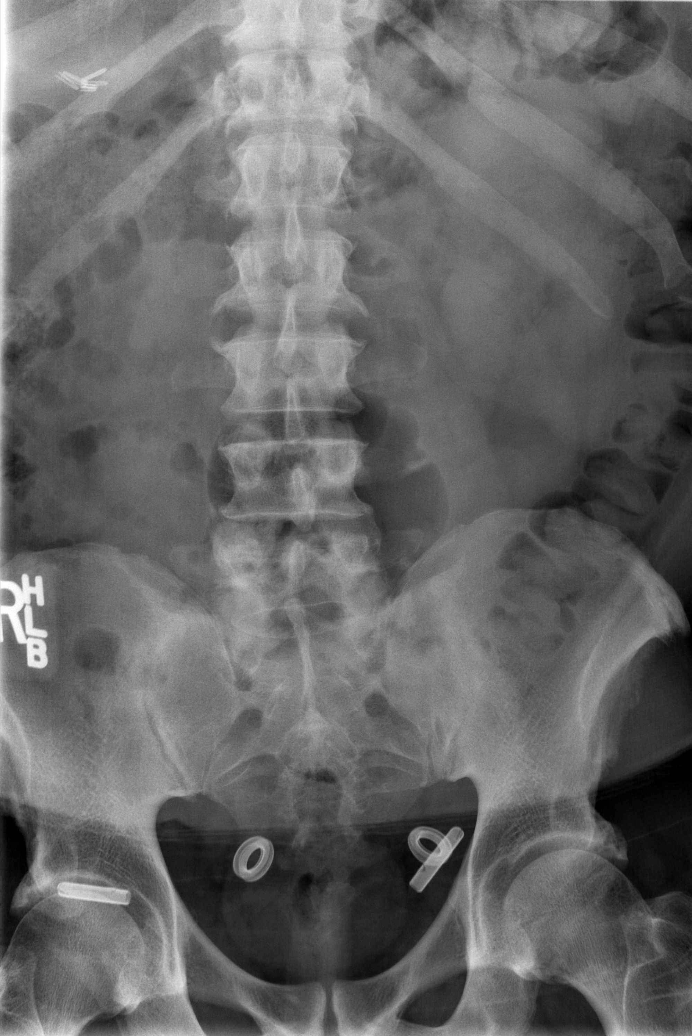

[t l-spine oblique exposure (1 of 2)]
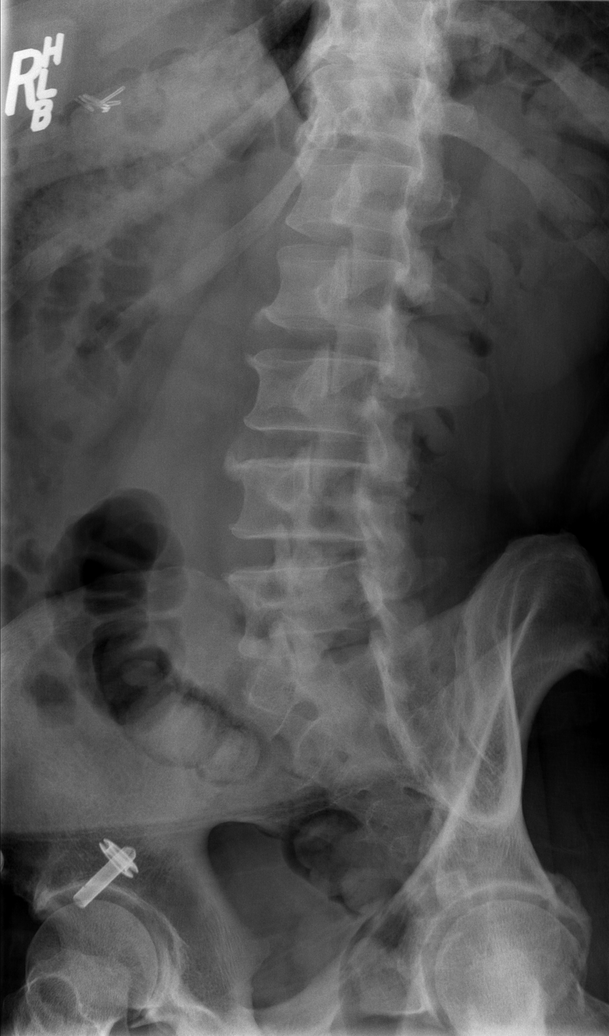

[t l-spine oblique exposure (2 of 2)]
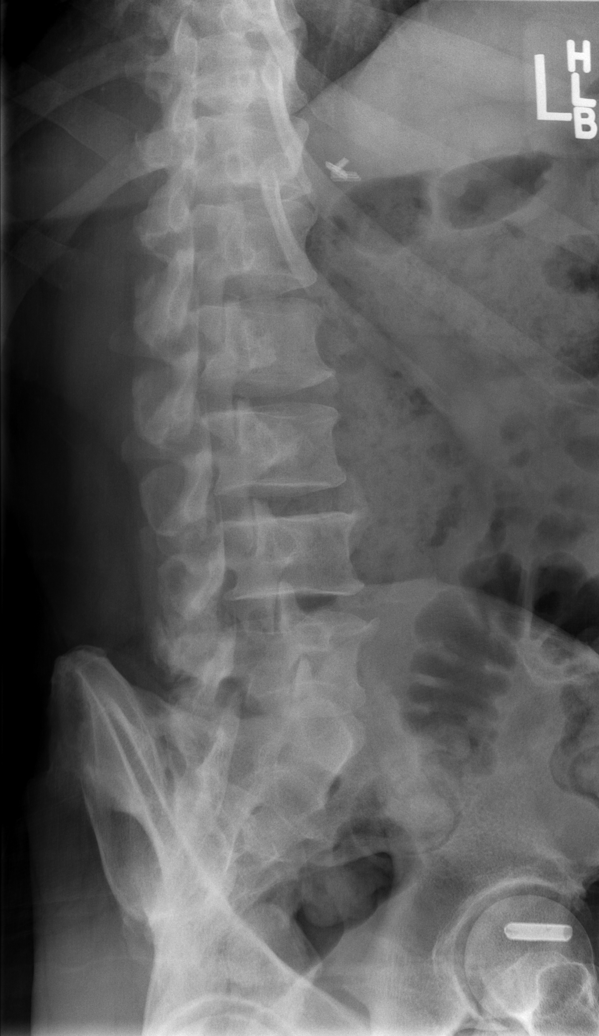

[t l-spine lat]
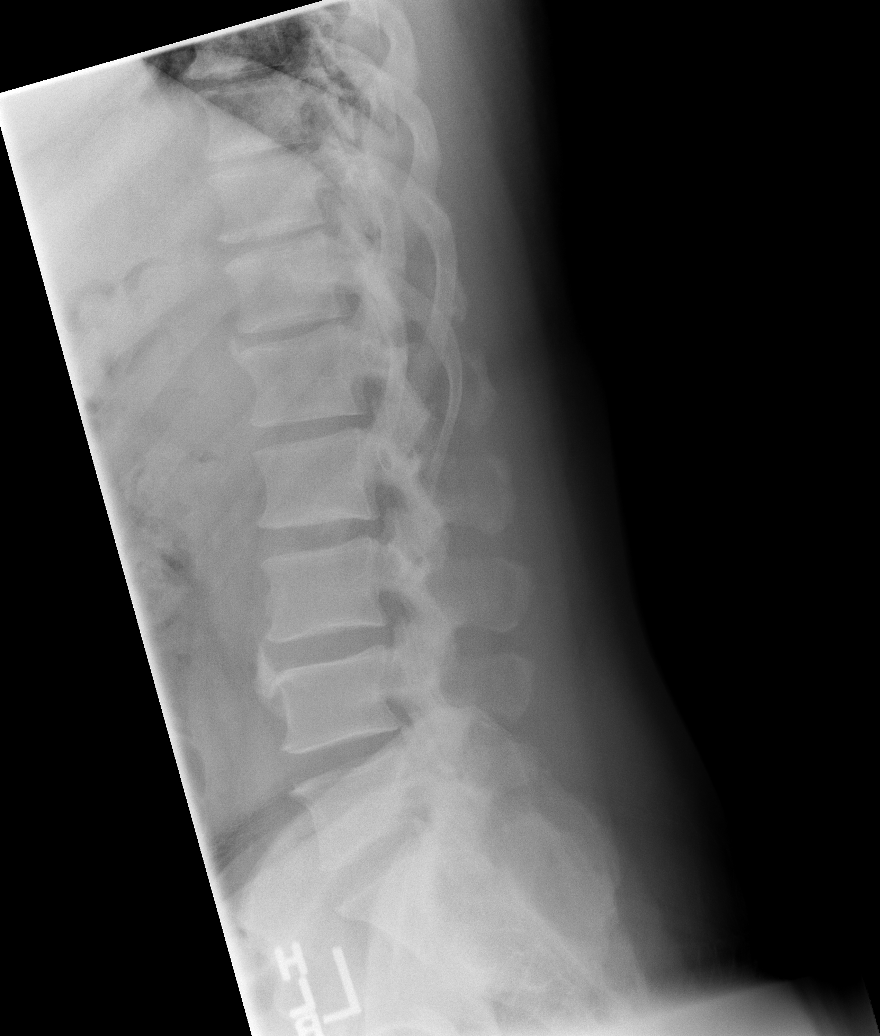

[t l-spine l5-s1 spot]
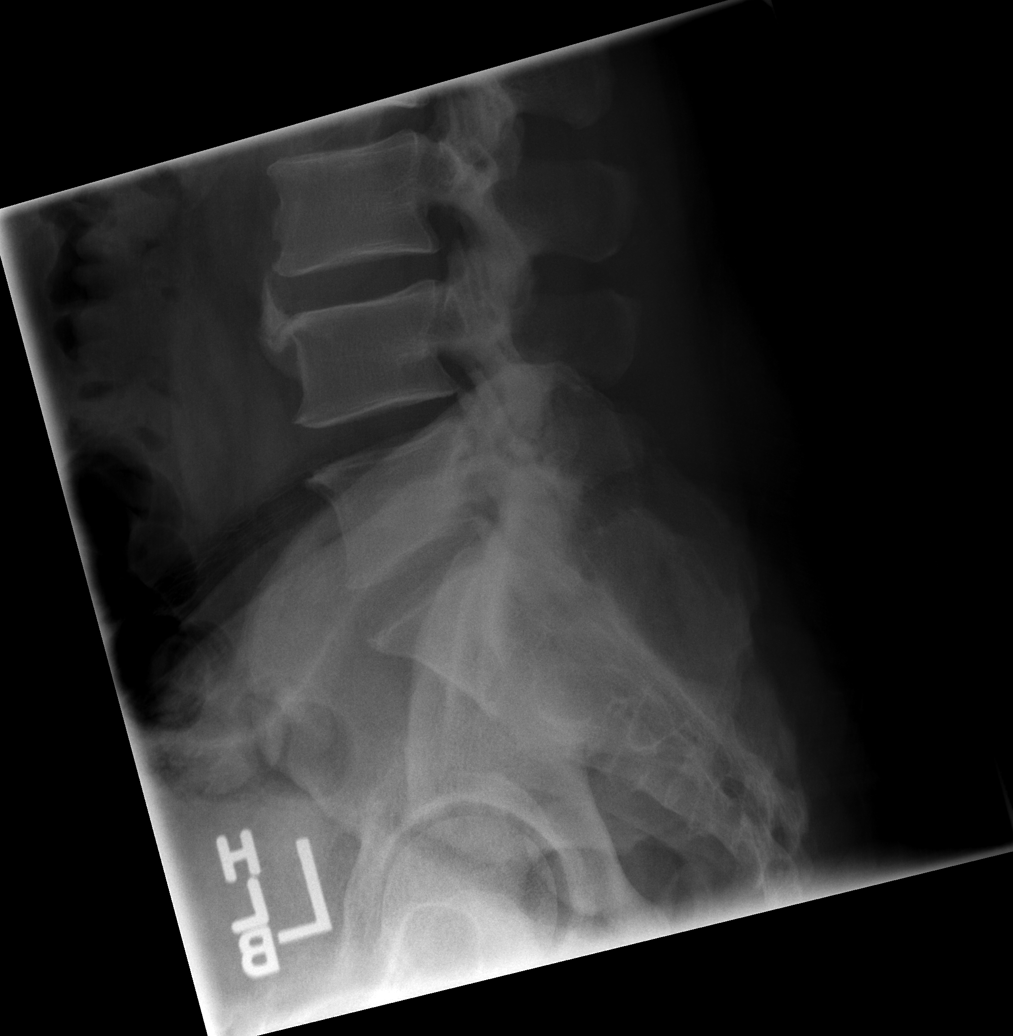

[5 of 5 positions shown; findings below may reference images not displayed]

FINDINGS: Normal frontal alignment. There is straightening of the normal
lumbar lordosis. No sagittal spondylolisthesis. Vertebral body
heights are maintained. Moderate anterior L3-4 and mild anterior
T12-L1 endplate osteophytes. Mild posterior L5-S1 disc space
narrowing. No pars defect is identified. L5-S1 greater than L4-5
facet joint arthropathy.

Cholecystectomy clips.
IMPRESSION: Straightening of the normal lumbar lordosis. Very mild degenerative
disc and endplate changes.

## 2024-02-06 NOTE — Progress Notes (Deleted)
 Medical Nutrition Therapy  Appointment Start time:  ***  Appointment End time:  ***  Primary concerns today: ***  Referral diagnosis: Obesity, unspecified, Essential (primary) hypertension, Mixed hyperlipidemia  Preferred learning style: *** (auditory, visual, hands on, no preference indicated) Learning readiness: *** (not ready, contemplating, ready, change in progress)   NUTRITION ASSESSMENT    Clinical Medical Hx: *** Medications: *** Labs: *** Notable Signs/Symptoms: ***  Lifestyle & Dietary Hx ***  Estimated daily fluid intake: *** oz Supplements: *** Sleep: *** Stress / self-care: *** Current average weekly physical activity: ***  24-Hr Dietary Recall First Meal: *** Snack: *** Second Meal: *** Snack: *** Third Meal: *** Snack: *** Beverages: ***  Estimated Energy Needs Calories: *** Carbohydrate: ***g Protein: ***g Fat: ***g   NUTRITION DIAGNOSIS  {CHL AMB NUTRITIONAL DIAGNOSIS:[id number]}   NUTRITION INTERVENTION  Nutrition education (E-1) on the following topics:  ***  Handouts Provided Include  ***  Learning Style & Readiness for Change Teaching method utilized: Visual & Auditory  Demonstrated degree of understanding via: Teach Back  Barriers to learning/adherence to lifestyle change: ***  Goals Established by Pt ***   MONITORING & EVALUATION Dietary intake, weekly physical activity, and *** in ***.  Next Steps  Patient is to ***.

## 2024-02-15 ENCOUNTER — Ambulatory Visit: Admitting: Dietician

## 2024-02-15 DIAGNOSIS — E782 Mixed hyperlipidemia: Secondary | ICD-10-CM

## 2024-02-15 DIAGNOSIS — I10 Essential (primary) hypertension: Secondary | ICD-10-CM

## 2024-02-15 DIAGNOSIS — E669 Obesity, unspecified: Secondary | ICD-10-CM

## 2024-03-16 NOTE — Progress Notes (Deleted)
 incoming  Medical Nutrition Therapy  Appointment Start time:  ***  Appointment End time:  ***  Primary concerns today: ***  Referral diagnosis: Obesity, unspecified, Essential (primary) hypertension, Mixed hyperlipidemia  Preferred learning style: *** (auditory, visual, hands on, no preference indicated) Learning readiness: *** (not ready, contemplating, ready, change in progress)   NUTRITION ASSESSMENT    Clinical Medical Hx: *** Medications: *** Labs: *** Notable Signs/Symptoms: ***  Lifestyle & Dietary Hx ***  Estimated daily fluid intake: *** oz Supplements: *** Sleep: *** Stress / self-care: *** Current average weekly physical activity: ***  24-Hr Dietary Recall First Meal: *** Snack: *** Second Meal: *** Snack: *** Third Meal: *** Snack: *** Beverages: ***  Estimated Energy Needs Calories: *** Carbohydrate: ***g Protein: ***g Fat: ***g   NUTRITION DIAGNOSIS  {CHL AMB NUTRITIONAL DIAGNOSIS:[id number]}   NUTRITION INTERVENTION  Nutrition education (E-1) on the following topics:  ***  Handouts Provided Include  ***  Learning Style & Readiness for Change Teaching method utilized: Visual & Auditory  Demonstrated degree of understanding via: Teach Back  Barriers to learning/adherence to lifestyle change: ***  Goals Established by Pt ***   MONITORING & EVALUATION Dietary intake, weekly physical activity, and *** in ***.  Next Steps  Patient is to ***.

## 2024-03-23 ENCOUNTER — Encounter: Admitting: Dietician

## 2024-03-23 DIAGNOSIS — E669 Obesity, unspecified: Secondary | ICD-10-CM

## 2024-03-23 DIAGNOSIS — E782 Mixed hyperlipidemia: Secondary | ICD-10-CM

## 2024-03-23 DIAGNOSIS — I10 Essential (primary) hypertension: Secondary | ICD-10-CM

## 2024-04-05 NOTE — Progress Notes (Deleted)
 incoming  Medical Nutrition Therapy  Appointment Start time:  ***  Appointment End time:  ***  Primary concerns today: ***  Referral diagnosis: Obesity, unspecified, Essential (primary) hypertension, Mixed hyperlipidemia  Preferred learning style: *** (auditory, visual, hands on, no preference indicated) Learning readiness: *** (not ready, contemplating, ready, change in progress)   NUTRITION ASSESSMENT    Clinical Medical Hx: *** Medications: *** Labs: *** Notable Signs/Symptoms: ***  Lifestyle & Dietary Hx ***  Estimated daily fluid intake: *** oz Supplements: *** Sleep: *** Stress / self-care: *** Current average weekly physical activity: ***  24-Hr Dietary Recall First Meal: *** Snack: *** Second Meal: *** Snack: *** Third Meal: *** Snack: *** Beverages: ***  Estimated Energy Needs Calories: *** Carbohydrate: ***g Protein: ***g Fat: ***g   NUTRITION DIAGNOSIS  {CHL AMB NUTRITIONAL DIAGNOSIS:[id number]}   NUTRITION INTERVENTION  Nutrition education (E-1) on the following topics:  ***  Handouts Provided Include  ***  Learning Style & Readiness for Change Teaching method utilized: Visual & Auditory  Demonstrated degree of understanding via: Teach Back  Barriers to learning/adherence to lifestyle change: ***  Goals Established by Pt ***   MONITORING & EVALUATION Dietary intake, weekly physical activity, and *** in ***.  Next Steps  Patient is to ***.

## 2024-04-19 ENCOUNTER — Encounter: Admitting: Dietician

## 2024-04-19 DIAGNOSIS — E669 Obesity, unspecified: Secondary | ICD-10-CM

## 2024-04-19 DIAGNOSIS — I10 Essential (primary) hypertension: Secondary | ICD-10-CM

## 2024-04-19 DIAGNOSIS — E782 Mixed hyperlipidemia: Secondary | ICD-10-CM

## 2024-04-25 ENCOUNTER — Other Ambulatory Visit: Payer: Self-pay | Admitting: Adult Health
